# Patient Record
Sex: Male | Born: 1950 | Race: White | Hispanic: No | Marital: Married | Smoking: Former smoker
Health system: Southern US, Community
[De-identification: ages and names within clinical notes are randomized; demographics above are authoritative.]

## PROBLEM LIST (undated history)

## (undated) DIAGNOSIS — R200 Anesthesia of skin: Secondary | ICD-10-CM

## (undated) DIAGNOSIS — E039 Hypothyroidism, unspecified: Secondary | ICD-10-CM

## (undated) DIAGNOSIS — I10 Essential (primary) hypertension: Secondary | ICD-10-CM

## (undated) DIAGNOSIS — K5790 Diverticulosis of intestine, part unspecified, without perforation or abscess without bleeding: Secondary | ICD-10-CM

## (undated) DIAGNOSIS — Q639 Congenital malformation of kidney, unspecified: Secondary | ICD-10-CM

## (undated) DIAGNOSIS — C801 Malignant (primary) neoplasm, unspecified: Secondary | ICD-10-CM

## (undated) DIAGNOSIS — M1990 Unspecified osteoarthritis, unspecified site: Secondary | ICD-10-CM

## (undated) DIAGNOSIS — E7800 Pure hypercholesterolemia, unspecified: Secondary | ICD-10-CM

## (undated) DIAGNOSIS — G4730 Sleep apnea, unspecified: Secondary | ICD-10-CM

## (undated) DIAGNOSIS — F419 Anxiety disorder, unspecified: Secondary | ICD-10-CM

## (undated) HISTORY — PX: APPENDECTOMY: SHX54

## (undated) HISTORY — PX: CARDIAC CATHETERIZATION: SHX172

## (undated) HISTORY — PX: CATARACT EXTRACTION W/ INTRAOCULAR LENS IMPLANT: SHX1309

---

## 1990-01-07 HISTORY — PX: COLON SURGERY: SHX602

## 1999-12-07 ENCOUNTER — Encounter: Admission: RE | Admit: 1999-12-07 | Discharge: 1999-12-07 | Payer: Self-pay | Admitting: Internal Medicine

## 1999-12-07 ENCOUNTER — Inpatient Hospital Stay (HOSPITAL_COMMUNITY): Admission: EM | Admit: 1999-12-07 | Discharge: 1999-12-09 | Payer: Self-pay | Admitting: Emergency Medicine

## 1999-12-07 ENCOUNTER — Encounter: Payer: Self-pay | Admitting: Internal Medicine

## 1999-12-07 ENCOUNTER — Encounter: Payer: Self-pay | Admitting: Neurology

## 2002-12-03 ENCOUNTER — Other Ambulatory Visit: Payer: Self-pay

## 2005-01-09 ENCOUNTER — Ambulatory Visit: Payer: Self-pay | Admitting: Cardiology

## 2005-06-05 ENCOUNTER — Ambulatory Visit: Admission: RE | Admit: 2005-06-05 | Discharge: 2005-06-05 | Payer: Self-pay | Admitting: Internal Medicine

## 2009-01-07 HISTORY — PX: HERNIA REPAIR: SHX51

## 2011-01-08 HISTORY — PX: NASAL SINUS SURGERY: SHX719

## 2011-04-18 ENCOUNTER — Ambulatory Visit: Payer: Self-pay | Admitting: Otolaryngology

## 2011-06-10 ENCOUNTER — Other Ambulatory Visit (HOSPITAL_COMMUNITY): Payer: Self-pay | Admitting: Neurosurgery

## 2011-06-10 DIAGNOSIS — M4712 Other spondylosis with myelopathy, cervical region: Secondary | ICD-10-CM

## 2011-06-10 DIAGNOSIS — M5000 Cervical disc disorder with myelopathy, unspecified cervical region: Secondary | ICD-10-CM

## 2011-06-10 DIAGNOSIS — M67919 Unspecified disorder of synovium and tendon, unspecified shoulder: Secondary | ICD-10-CM

## 2011-06-13 ENCOUNTER — Other Ambulatory Visit (HOSPITAL_COMMUNITY): Payer: Self-pay

## 2011-06-17 ENCOUNTER — Other Ambulatory Visit (HOSPITAL_COMMUNITY): Payer: Self-pay | Admitting: Neurosurgery

## 2011-06-17 ENCOUNTER — Ambulatory Visit (HOSPITAL_COMMUNITY)
Admission: RE | Admit: 2011-06-17 | Discharge: 2011-06-17 | Disposition: A | Discharge status: Home / Self Care | Payer: BC Managed Care – PPO | Source: Ambulatory Visit | Attending: Neurosurgery | Admitting: Neurosurgery

## 2011-06-17 DIAGNOSIS — M47812 Spondylosis without myelopathy or radiculopathy, cervical region: Secondary | ICD-10-CM

## 2011-06-17 DIAGNOSIS — M25519 Pain in unspecified shoulder: Secondary | ICD-10-CM | POA: Insufficient documentation

## 2011-06-17 DIAGNOSIS — M719 Bursopathy, unspecified: Secondary | ICD-10-CM | POA: Insufficient documentation

## 2011-06-17 DIAGNOSIS — M67919 Unspecified disorder of synovium and tendon, unspecified shoulder: Secondary | ICD-10-CM | POA: Insufficient documentation

## 2011-06-17 DIAGNOSIS — M4712 Other spondylosis with myelopathy, cervical region: Secondary | ICD-10-CM | POA: Insufficient documentation

## 2011-06-17 DIAGNOSIS — M5000 Cervical disc disorder with myelopathy, unspecified cervical region: Secondary | ICD-10-CM | POA: Insufficient documentation

## 2011-06-24 ENCOUNTER — Other Ambulatory Visit: Payer: Self-pay | Admitting: Neurosurgery

## 2011-08-19 ENCOUNTER — Encounter (HOSPITAL_COMMUNITY)
Admission: RE | Admit: 2011-08-19 | Discharge: 2011-08-19 | Disposition: A | Discharge status: Home / Self Care | Payer: BC Managed Care – PPO | Source: Ambulatory Visit | Attending: Neurosurgery | Admitting: Neurosurgery

## 2011-08-19 ENCOUNTER — Encounter (HOSPITAL_COMMUNITY): Payer: Self-pay

## 2011-08-19 HISTORY — DX: Congenital malformation of kidney, unspecified: Q63.9

## 2011-08-19 HISTORY — DX: Essential (primary) hypertension: I10

## 2011-08-19 HISTORY — DX: Unspecified osteoarthritis, unspecified site: M19.90

## 2011-08-19 HISTORY — DX: Hypothyroidism, unspecified: E03.9

## 2011-08-19 HISTORY — DX: Sleep apnea, unspecified: G47.30

## 2011-08-19 HISTORY — DX: Malignant (primary) neoplasm, unspecified: C80.1

## 2011-08-19 LAB — BASIC METABOLIC PANEL
BUN: 15 mg/dL (ref 6–23)
CO2: 28 mEq/L (ref 19–32)
Calcium: 9.4 mg/dL (ref 8.4–10.5)
Chloride: 105 mEq/L (ref 96–112)
Creatinine, Ser: 1.08 mg/dL (ref 0.50–1.35)
GFR calc Af Amer: 84 mL/min — ABNORMAL LOW (ref >90)
GFR calc non Af Amer: 72 mL/min — ABNORMAL LOW (ref >90)
Glucose, Bld: 94 mg/dL (ref 70–99)
Potassium: 4.1 mEq/L (ref 3.5–5.1)
Sodium: 142 mEq/L (ref 135–145)

## 2011-08-19 LAB — CBC
HCT: 36.3 % — ABNORMAL LOW (ref 39.0–52.0)
Hemoglobin: 11.9 g/dL — ABNORMAL LOW (ref 13.0–17.0)
MCH: 31 pg (ref 26.0–34.0)
MCHC: 32.8 g/dL (ref 30.0–36.0)
MCV: 94.5 fL (ref 78.0–100.0)
Platelets: 235 10*3/uL (ref 150–400)
RBC: 3.84 MIL/uL — ABNORMAL LOW (ref 4.22–5.81)
RDW: 14.8 % (ref 11.5–15.5)
WBC: 6.7 10*3/uL (ref 4.0–10.5)

## 2011-08-19 LAB — SURGICAL PCR SCREEN
MRSA, PCR: NEGATIVE
Staphylococcus aureus: POSITIVE — AB

## 2011-08-19 NOTE — Progress Notes (Signed)
req'd sleep study from [facility] done 2 yrs ago, and ekg and cxr done jan 13 at [facility] dr Jeanmarie Plant [place] [phone]

## 2011-08-19 NOTE — Pre-Procedure Instructions (Addendum)
20 NEFTALY SWISS  08/19/2011   Your procedure is scheduled on:  08/28/11  Report to [facility] at 630 AM.  Call this number if you have problems the morning of surgery: [phone]   Remember:   Do not eat food: or drink After Midnight.    Take these medicines the morning of surgery with A SIP OF WATER: exforge, singulair, synthroid   Do not wear jewelry, make-up or nail polish.  Do not wear lotions, powders, or perfumes. You may wear deodorant.  Do not shave 48 hours prior to surgery. Men may shave face and neck.  Do not bring valuables to the hospital.  Contacts, dentures or bridgework may not be worn into surgery.  Leave suitcase in the car. After surgery it may be brought to your room.  For patients admitted to the hospital, checkout time is 11:00 AM the day of discharge.   Patients discharged the day of surgery will not be allowed to drive home.  Name and phone number of your driver: Liborio Nixon wife [phone]  Special Instructions: CHG Shower Use Special Wash: 1/2 bottle night before surgery and 1/2 bottle morning of surgery.   Please read over the following fact sheets that you were given: Pain Booklet, Coughing and Deep Breathing, MRSA Information and Surgical Site Infection Prevention   Exforge, synthroid, singulair

## 2011-08-27 MED ORDER — CEFAZOLIN SODIUM 1-5 GM-% IV SOLN: 1.0000 g | INTRAVENOUS | Status: DC

## 2011-08-27 MED ORDER — DEXTROSE 5 % IV SOLN
3.0000 g | INTRAVENOUS | Status: AC
  Administered 2011-08-28: 3 g via INTRAVENOUS
  Administered 2011-08-28: 1 g via INTRAVENOUS
  Filled 2011-08-27: qty 3000

## 2011-08-28 ENCOUNTER — Encounter (HOSPITAL_COMMUNITY): Payer: Self-pay | Admitting: *Deleted

## 2011-08-28 ENCOUNTER — Ambulatory Visit (HOSPITAL_COMMUNITY)
Admission: RE | Admit: 2011-08-28 | Discharge: 2011-08-29 | Disposition: A | Discharge status: Home / Self Care | Payer: BC Managed Care – PPO | Source: Ambulatory Visit | Attending: Neurosurgery | Admitting: Neurosurgery

## 2011-08-28 ENCOUNTER — Encounter (HOSPITAL_COMMUNITY)
Admission: RE | Disposition: A | Discharge status: Home / Self Care | Payer: Self-pay | Source: Ambulatory Visit | Attending: Neurosurgery

## 2011-08-28 ENCOUNTER — Encounter (HOSPITAL_COMMUNITY): Payer: Self-pay | Admitting: Anesthesiology

## 2011-08-28 ENCOUNTER — Ambulatory Visit (HOSPITAL_COMMUNITY): Payer: BC Managed Care – PPO

## 2011-08-28 ENCOUNTER — Ambulatory Visit (HOSPITAL_COMMUNITY): Payer: BC Managed Care – PPO | Admitting: Anesthesiology

## 2011-08-28 DIAGNOSIS — G4730 Sleep apnea, unspecified: Secondary | ICD-10-CM | POA: Insufficient documentation

## 2011-08-28 DIAGNOSIS — M4712 Other spondylosis with myelopathy, cervical region: Secondary | ICD-10-CM | POA: Insufficient documentation

## 2011-08-28 DIAGNOSIS — Z85038 Personal history of other malignant neoplasm of large intestine: Secondary | ICD-10-CM | POA: Insufficient documentation

## 2011-08-28 DIAGNOSIS — M5000 Cervical disc disorder with myelopathy, unspecified cervical region: Secondary | ICD-10-CM | POA: Insufficient documentation

## 2011-08-28 DIAGNOSIS — Z01812 Encounter for preprocedural laboratory examination: Secondary | ICD-10-CM | POA: Insufficient documentation

## 2011-08-28 DIAGNOSIS — E039 Hypothyroidism, unspecified: Secondary | ICD-10-CM | POA: Insufficient documentation

## 2011-08-28 DIAGNOSIS — I10 Essential (primary) hypertension: Secondary | ICD-10-CM | POA: Insufficient documentation

## 2011-08-28 HISTORY — PX: ANTERIOR CERVICAL DECOMP/DISCECTOMY FUSION: SHX1161

## 2011-08-28 SURGERY — ANTERIOR CERVICAL DECOMPRESSION/DISCECTOMY FUSION 3 LEVELS
Anesthesia: General | Site: Neck | Wound class: Clean

## 2011-08-28 MED ORDER — THROMBIN 5000 UNITS EX SOLR
OROMUCOSAL | Status: DC | PRN
  Administered 2011-08-28 (×2): via TOPICAL

## 2011-08-28 MED ORDER — IRBESARTAN 150 MG PO TABS
150.0000 mg | ORAL_TABLET | Freq: Every day | ORAL | Status: DC
  Administered 2011-08-28: 150 mg via ORAL
  Filled 2011-08-28 (×2): qty 1

## 2011-08-28 MED ORDER — KETOROLAC TROMETHAMINE 30 MG/ML IJ SOLN
30.0000 mg | Freq: Once | INTRAMUSCULAR | Status: AC
  Administered 2011-08-28: 30 mg via INTRAVENOUS

## 2011-08-28 MED ORDER — HYDROXYZINE HCL 50 MG/ML IM SOLN
50.0000 mg | INTRAMUSCULAR | Status: DC | PRN
  Administered 2011-08-28: 50 mg via INTRAMUSCULAR
  Filled 2011-08-28: qty 1

## 2011-08-28 MED ORDER — HYDROXYZINE HCL 25 MG PO TABS: 50.0000 mg | ORAL_TABLET | ORAL | Status: DC | PRN

## 2011-08-28 MED ORDER — CEFAZOLIN SODIUM 1-5 GM-% IV SOLN
INTRAVENOUS | Status: AC
  Filled 2011-08-28: qty 50

## 2011-08-28 MED ORDER — HYDROMORPHONE HCL PF 1 MG/ML IJ SOLN
INTRAMUSCULAR | Status: AC
  Filled 2011-08-28: qty 1

## 2011-08-28 MED ORDER — THROMBIN 20000 UNITS EX SOLR
CUTANEOUS | Status: DC | PRN
  Administered 2011-08-28: 09:00:00 via TOPICAL

## 2011-08-28 MED ORDER — SODIUM CHLORIDE 0.9 % IR SOLN
Status: DC | PRN
  Administered 2011-08-28: 09:00:00

## 2011-08-28 MED ORDER — SODIUM CHLORIDE 0.9 % IV SOLN
INTRAVENOUS | Status: AC
  Filled 2011-08-28: qty 500

## 2011-08-28 MED ORDER — ALUM & MAG HYDROXIDE-SIMETH 200-200-20 MG/5ML PO SUSP: 30.0000 mL | Freq: Four times a day (QID) | ORAL | Status: DC | PRN

## 2011-08-28 MED ORDER — LIDOCAINE-EPINEPHRINE 1 %-1:100000 IJ SOLN
INTRAMUSCULAR | Status: DC | PRN
  Administered 2011-08-28: 14 mL

## 2011-08-28 MED ORDER — PHENOL 1.4 % MT LIQD: 1.0000 | OROMUCOSAL | Status: DC | PRN

## 2011-08-28 MED ORDER — ACETAMINOPHEN 10 MG/ML IV SOLN
INTRAVENOUS | Status: AC
  Filled 2011-08-28: qty 100

## 2011-08-28 MED ORDER — KETOROLAC TROMETHAMINE 30 MG/ML IJ SOLN
30.0000 mg | Freq: Four times a day (QID) | INTRAMUSCULAR | Status: DC
  Administered 2011-08-28 – 2011-08-29 (×3): 30 mg via INTRAVENOUS
  Filled 2011-08-28 (×7): qty 1

## 2011-08-28 MED ORDER — ROCURONIUM BROMIDE 100 MG/10ML IV SOLN
INTRAVENOUS | Status: DC | PRN
  Administered 2011-08-28: 50 mg via INTRAVENOUS

## 2011-08-28 MED ORDER — BACITRACIN 50000 UNITS IM SOLR
INTRAMUSCULAR | Status: AC
  Filled 2011-08-28: qty 1

## 2011-08-28 MED ORDER — MIDAZOLAM HCL 5 MG/5ML IJ SOLN
INTRAMUSCULAR | Status: DC | PRN
  Administered 2011-08-28: 2 mg via INTRAVENOUS

## 2011-08-28 MED ORDER — BUPIVACAINE HCL (PF) 0.5 % IJ SOLN
INTRAMUSCULAR | Status: DC | PRN
  Administered 2011-08-28: 14 mL

## 2011-08-28 MED ORDER — PHENYLEPHRINE HCL 10 MG/ML IJ SOLN
10.0000 mg | INTRAVENOUS | Status: DC | PRN
  Administered 2011-08-28: 10 ug/min via INTRAVENOUS

## 2011-08-28 MED ORDER — ATORVASTATIN CALCIUM 20 MG PO TABS
20.0000 mg | ORAL_TABLET | Freq: Every day | ORAL | Status: DC
  Administered 2011-08-28: 20 mg via ORAL
  Filled 2011-08-28 (×2): qty 1

## 2011-08-28 MED ORDER — PHENYLEPHRINE HCL 10 MG/ML IJ SOLN
INTRAMUSCULAR | Status: DC | PRN
  Administered 2011-08-28 (×2): 40 ug via INTRAVENOUS

## 2011-08-28 MED ORDER — ACETAMINOPHEN 325 MG PO TABS: 650.0000 mg | ORAL_TABLET | ORAL | Status: DC | PRN

## 2011-08-28 MED ORDER — AMLODIPINE BESYLATE 10 MG PO TABS
10.0000 mg | ORAL_TABLET | Freq: Every day | ORAL | Status: DC
  Filled 2011-08-28: qty 1

## 2011-08-28 MED ORDER — PROPOFOL 10 MG/ML IV EMUL: INTRAVENOUS | Status: DC | PRN

## 2011-08-28 MED ORDER — THROMBIN 20000 UNITS EX KIT
PACK | CUTANEOUS | Status: DC | PRN
  Administered 2011-08-28: 20000 [IU] via TOPICAL

## 2011-08-28 MED ORDER — HYDROCODONE-ACETAMINOPHEN 5-325 MG PO TABS: 1.0000 | ORAL_TABLET | ORAL | Status: DC | PRN

## 2011-08-28 MED ORDER — MENTHOL 3 MG MT LOZG
1.0000 | LOZENGE | OROMUCOSAL | Status: DC | PRN
  Filled 2011-08-28: qty 9

## 2011-08-28 MED ORDER — ONDANSETRON HCL 4 MG/2ML IJ SOLN
INTRAMUSCULAR | Status: DC | PRN
  Administered 2011-08-28: 4 mg via INTRAVENOUS

## 2011-08-28 MED ORDER — GLYCOPYRROLATE 0.2 MG/ML IJ SOLN
INTRAMUSCULAR | Status: DC | PRN
  Administered 2011-08-28: .6 mg via INTRAVENOUS

## 2011-08-28 MED ORDER — ZOLPIDEM TARTRATE 5 MG PO TABS: 10.0000 mg | ORAL_TABLET | Freq: Every evening | ORAL | Status: DC | PRN

## 2011-08-28 MED ORDER — BISACODYL 10 MG RE SUPP: 10.0000 mg | Freq: Every day | RECTAL | Status: DC | PRN

## 2011-08-28 MED ORDER — MORPHINE SULFATE 4 MG/ML IJ SOLN: 4.0000 mg | INTRAMUSCULAR | Status: DC | PRN

## 2011-08-28 MED ORDER — AMLODIPINE BESYLATE-VALSARTAN 10-160 MG PO TABS: 1.0000 | ORAL_TABLET | Freq: Every day | ORAL | Status: DC

## 2011-08-28 MED ORDER — LACTATED RINGERS IV SOLN
INTRAVENOUS | Status: DC | PRN
  Administered 2011-08-28 (×3): via INTRAVENOUS

## 2011-08-28 MED ORDER — THROMBIN 5000 UNITS EX SOLR
CUTANEOUS | Status: DC | PRN
  Administered 2011-08-28 (×2): 5000 [IU] via TOPICAL

## 2011-08-28 MED ORDER — OXYCODONE-ACETAMINOPHEN 5-325 MG PO TABS
1.0000 | ORAL_TABLET | ORAL | Status: DC | PRN
Start: 2011-08-28 — End: 2011-08-29
  Administered 2011-08-29: 1 via ORAL
  Filled 2011-08-28: qty 1

## 2011-08-28 MED ORDER — OXYCODONE HCL 5 MG PO TABS
5.0000 mg | ORAL_TABLET | ORAL | Status: DC | PRN
  Administered 2011-08-28: 5 mg via ORAL
  Filled 2011-08-28: qty 1

## 2011-08-28 MED ORDER — SODIUM CHLORIDE 0.9 % IV SOLN: 250.0000 mL | INTRAVENOUS | Status: DC

## 2011-08-28 MED ORDER — DEXAMETHASONE SODIUM PHOSPHATE 4 MG/ML IJ SOLN
INTRAMUSCULAR | Status: DC | PRN
  Administered 2011-08-28: 8 mg via INTRAVENOUS

## 2011-08-28 MED ORDER — ACETAMINOPHEN 10 MG/ML IV SOLN
INTRAVENOUS | Status: DC | PRN
  Administered 2011-08-28: 1000 mg via INTRAVENOUS

## 2011-08-28 MED ORDER — LIDOCAINE HCL (CARDIAC) 20 MG/ML IV SOLN
INTRAVENOUS | Status: DC | PRN
  Administered 2011-08-28: 50 mg via INTRAVENOUS

## 2011-08-28 MED ORDER — AMLODIPINE BESYLATE 10 MG PO TABS
10.0000 mg | ORAL_TABLET | Freq: Every day | ORAL | Status: DC
  Administered 2011-08-28: 10 mg via ORAL
  Filled 2011-08-28 (×2): qty 1

## 2011-08-28 MED ORDER — HYDROMORPHONE HCL PF 1 MG/ML IJ SOLN
0.2500 mg | INTRAMUSCULAR | Status: DC | PRN
  Administered 2011-08-28 (×2): 0.5 mg via INTRAVENOUS

## 2011-08-28 MED ORDER — CYCLOBENZAPRINE HCL 10 MG PO TABS
10.0000 mg | ORAL_TABLET | Freq: Three times a day (TID) | ORAL | Status: DC | PRN
  Administered 2011-08-28: 10 mg via ORAL
  Filled 2011-08-28: qty 1

## 2011-08-28 MED ORDER — 0.9 % SODIUM CHLORIDE (POUR BTL) OPTIME
TOPICAL | Status: DC | PRN
  Administered 2011-08-28: 1000 mL

## 2011-08-28 MED ORDER — KCL IN DEXTROSE-NACL 20-5-0.45 MEQ/L-%-% IV SOLN
INTRAVENOUS | Status: DC
  Administered 2011-08-28: 17:00:00 via INTRAVENOUS
  Filled 2011-08-28 (×7): qty 1000

## 2011-08-28 MED ORDER — SODIUM CHLORIDE 0.9 % IJ SOLN: 3.0000 mL | INTRAMUSCULAR | Status: DC | PRN

## 2011-08-28 MED ORDER — MAGNESIUM HYDROXIDE 400 MG/5ML PO SUSP: 30.0000 mL | Freq: Every day | ORAL | Status: DC | PRN

## 2011-08-28 MED ORDER — ACETAMINOPHEN 10 MG/ML IV SOLN
1000.0000 mg | Freq: Four times a day (QID) | INTRAVENOUS | Status: DC
  Administered 2011-08-28 – 2011-08-29 (×3): 1000 mg via INTRAVENOUS
  Filled 2011-08-28 (×4): qty 100

## 2011-08-28 MED ORDER — KETOROLAC TROMETHAMINE 30 MG/ML IJ SOLN
INTRAMUSCULAR | Status: AC
  Filled 2011-08-28: qty 1

## 2011-08-28 MED ORDER — MONTELUKAST SODIUM 10 MG PO TABS
10.0000 mg | ORAL_TABLET | Freq: Every day | ORAL | Status: DC
  Filled 2011-08-28: qty 1

## 2011-08-28 MED ORDER — LEVOTHYROXINE SODIUM 200 MCG PO TABS
200.0000 ug | ORAL_TABLET | Freq: Every day | ORAL | Status: DC
  Filled 2011-08-28: qty 1

## 2011-08-28 MED ORDER — IRBESARTAN 150 MG PO TABS
150.0000 mg | ORAL_TABLET | Freq: Every day | ORAL | Status: DC
  Filled 2011-08-28: qty 1

## 2011-08-28 MED ORDER — LIDOCAINE HCL (CARDIAC) 20 MG/ML IV SOLN: INTRAVENOUS | Status: DC | PRN

## 2011-08-28 MED ORDER — ARTIFICIAL TEARS OP OINT
TOPICAL_OINTMENT | OPHTHALMIC | Status: DC | PRN
  Administered 2011-08-28: 1 via OPHTHALMIC

## 2011-08-28 MED ORDER — VECURONIUM BROMIDE 10 MG IV SOLR
INTRAVENOUS | Status: DC | PRN
  Administered 2011-08-28: 3 mg via INTRAVENOUS
  Administered 2011-08-28: 4 mg via INTRAVENOUS
  Administered 2011-08-28: 3 mg via INTRAVENOUS
  Administered 2011-08-28 (×2): 2 mg via INTRAVENOUS

## 2011-08-28 MED ORDER — ACETAMINOPHEN 650 MG RE SUPP: 650.0000 mg | RECTAL | Status: DC | PRN

## 2011-08-28 MED ORDER — FENTANYL CITRATE 0.05 MG/ML IJ SOLN
INTRAMUSCULAR | Status: DC | PRN
  Administered 2011-08-28 (×2): 100 ug via INTRAVENOUS
  Administered 2011-08-28 (×5): 50 ug via INTRAVENOUS

## 2011-08-28 MED ORDER — SODIUM CHLORIDE 0.9 % IJ SOLN: 3.0000 mL | Freq: Two times a day (BID) | INTRAMUSCULAR | Status: DC

## 2011-08-28 MED ORDER — PROPOFOL 10 MG/ML IV EMUL
INTRAVENOUS | Status: DC | PRN
  Administered 2011-08-28: 300 mg via INTRAVENOUS

## 2011-08-28 MED ORDER — NEOSTIGMINE METHYLSULFATE 1 MG/ML IJ SOLN
INTRAMUSCULAR | Status: DC | PRN
  Administered 2011-08-28: 4 mg via INTRAVENOUS

## 2011-08-28 MED ORDER — ONDANSETRON HCL 4 MG/2ML IJ SOLN: 4.0000 mg | Freq: Once | INTRAMUSCULAR | Status: DC | PRN

## 2011-08-28 SURGICAL SUPPLY — 58 items
ALLOGRAFT 7X14X11 (Bone Implant) ×6 IMPLANT
BAG DECANTER FOR FLEXI CONT (MISCELLANEOUS) ×2 IMPLANT
BIT DRILL NEURO 2X3.1 SFT TUCH (MISCELLANEOUS) ×1 IMPLANT
BLADE ULTRA TIP 2M (BLADE) ×2 IMPLANT
BRUSH SCRUB EZ PLAIN DRY (MISCELLANEOUS) ×2 IMPLANT
CANISTER SUCTION 2500CC (MISCELLANEOUS) ×2 IMPLANT
CLIP TI MEDIUM 6 (CLIP) ×2 IMPLANT
CLOTH BEACON ORANGE TIMEOUT ST (SAFETY) ×2 IMPLANT
CONT SPEC 4OZ CLIKSEAL STRL BL (MISCELLANEOUS) ×2 IMPLANT
COVER MAYO STAND STRL (DRAPES) ×2 IMPLANT
DECANTER SPIKE VIAL GLASS SM (MISCELLANEOUS) ×2 IMPLANT
DERMABOND ADVANCED (GAUZE/BANDAGES/DRESSINGS) ×2
DERMABOND ADVANCED .7 DNX12 (GAUZE/BANDAGES/DRESSINGS) ×2 IMPLANT
DRAPE LAPAROTOMY 100X72 PEDS (DRAPES) ×2 IMPLANT
DRAPE MICROSCOPE LEICA (MISCELLANEOUS) ×2 IMPLANT
DRAPE POUCH INSTRU U-SHP 10X18 (DRAPES) ×2 IMPLANT
DRAPE PROXIMA HALF (DRAPES) IMPLANT
DRILL NEURO 2X3.1 SOFT TOUCH (MISCELLANEOUS) ×2
ELECT COATED BLADE 2.86 ST (ELECTRODE) ×2 IMPLANT
ELECT REM PT RETURN 9FT ADLT (ELECTROSURGICAL) ×2
ELECTRODE REM PT RTRN 9FT ADLT (ELECTROSURGICAL) ×1 IMPLANT
GAUZE SPONGE 4X4 16PLY XRAY LF (GAUZE/BANDAGES/DRESSINGS) ×2 IMPLANT
GLOVE BIO SURGEON STRL SZ8 (GLOVE) ×2 IMPLANT
GLOVE BIOGEL PI IND STRL 7.0 (GLOVE) ×1 IMPLANT
GLOVE BIOGEL PI IND STRL 8 (GLOVE) ×1 IMPLANT
GLOVE BIOGEL PI INDICATOR 7.0 (GLOVE) ×1
GLOVE BIOGEL PI INDICATOR 8 (GLOVE) ×1
GLOVE ECLIPSE 7.5 STRL STRAW (GLOVE) ×2 IMPLANT
GLOVE EXAM NITRILE LRG STRL (GLOVE) ×4 IMPLANT
GLOVE EXAM NITRILE MD LF STRL (GLOVE) IMPLANT
GLOVE EXAM NITRILE XL STR (GLOVE) IMPLANT
GLOVE EXAM NITRILE XS STR PU (GLOVE) IMPLANT
GLOVE SURG SS PI 6.5 STRL IVOR (GLOVE) ×4 IMPLANT
GOWN BRE IMP SLV AUR LG STRL (GOWN DISPOSABLE) ×2 IMPLANT
GOWN BRE IMP SLV AUR XL STRL (GOWN DISPOSABLE) ×2 IMPLANT
GOWN STRL REIN 2XL LVL4 (GOWN DISPOSABLE) IMPLANT
GRAFT CORT CANC 9X14X11MM (Bone Implant) ×2 IMPLANT
HEAD HALTER (SOFTGOODS) ×2 IMPLANT
HEMOSTAT POWDER KIT SURGIFOAM (HEMOSTASIS) ×4 IMPLANT
KIT BASIN OR (CUSTOM PROCEDURE TRAY) ×2 IMPLANT
KIT ROOM TURNOVER OR (KITS) ×2 IMPLANT
NEEDLE HYPO 25X1 1.5 SAFETY (NEEDLE) ×2 IMPLANT
NS IRRIG 1000ML POUR BTL (IV SOLUTION) ×2 IMPLANT
PACK LAMINECTOMY NEURO (CUSTOM PROCEDURE TRAY) ×2 IMPLANT
PAD ARMBOARD 7.5X6 YLW CONV (MISCELLANEOUS) ×6 IMPLANT
PATTIES SURGICAL 1X1 (DISPOSABLE) ×2 IMPLANT
RUBBERBAND STERILE (MISCELLANEOUS) ×4 IMPLANT
SPONGE INTESTINAL PEANUT (DISPOSABLE) ×4 IMPLANT
SPONGE SURGIFOAM ABS GEL 100 (HEMOSTASIS) ×2 IMPLANT
STAPLER SKIN PROX WIDE 3.9 (STAPLE) ×2 IMPLANT
SUT VIC AB 0 CT1 18XCR BRD8 (SUTURE) IMPLANT
SUT VIC AB 0 CT1 8-18 (SUTURE)
SUT VIC AB 2-0 CP2 18 (SUTURE) ×2 IMPLANT
SUT VIC AB 3-0 SH 8-18 (SUTURE) ×4 IMPLANT
SYR 20ML ECCENTRIC (SYRINGE) ×2 IMPLANT
TOWEL OR 17X24 6PK STRL BLUE (TOWEL DISPOSABLE) ×2 IMPLANT
TOWEL OR 17X26 10 PK STRL BLUE (TOWEL DISPOSABLE) ×2 IMPLANT
WATER STERILE IRR 1000ML POUR (IV SOLUTION) ×2 IMPLANT

## 2011-08-28 NOTE — Op Note (Signed)
08/28/2011  12:56 PM  PATIENT:  Andrew Lam  60 y.o. 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  ANESTHESIA:   general  EBL:  Total I/O In: 2000 [I.V.:2000] Out: 375 [Urine:275; Blood:100]  COUNT: Correct per nursing staff  DICTATION: Patient was brought to the operating room placed under general endotracheal anesthesia. Patient was placed in 10 pounds of halter traction. The neck was prepped with Betadine soap and solution and draped in a sterile fashion. A obliquel incision was made on the left side of the neck paralleling the anterior border of the sternocleidomastoid.. The line of the incision was infiltrated with local anesthetic with epinephrine. Dissection was carried down thru the subcutaneous tissue and platysma, bipolar cautery was used to maintain hemostasis. Hemoclips were used to take several superficial veins. Dissection was then carried out thru an avascular plane leaving the sternocleidomastoid carotid artery and jugular vein laterally and the trachea and esophagus medially. The ventral aspect of the vertebral column was identified and a localizing x-ray was taken. The C4-5, C5-6, and C6-7 levels were identified. The annulus at each level was incised and the disc space entered. Discectomy was performed with micro-curettes and pituitary rongeurs. The operating microscope was draped and brought into the field provided additional magnification illumination and visualization. Discectomy was continued posteriorly  thru the disc space and then the cartilaginous endplate was removed using micro-curettes along with the high-speed drill. Posterior osteophytic overgrowth was removed at each level using the high-speed drill along with a 2 mm thin footplated Kerrison punch. Posterior longitudinal ligament along with disc herniation was carefully removed, decompressing the spinal canal and thecal sac. We then continued to remove osteophytic overgrowth and disc material decompressing the neural foramina and exiting nerve roots bilaterally. Once the decompression was completed hemostasis was established at each level with the use of Gelfoam with thrombin and bipolar cautery. The Gelfoam was removed the wound irrigated and hemostasis confirmed. We then measured the height of each intravertebral disc space level and selected a 7 millimeter in height structural allograft for the C4-5 level, a 7 millimeter in height structural allograft for the C5-6 level, and a 9 millimeter in height structural allograft for the C6-7 level . Each was hydrated in saline solution and then gently positioned in the intravertebral disc space and countersunk. We then selected a 55 millimeter in height Tether cervical plate. It was positioned over the fusion construct and secured to the vertebra with a pair of 4 x 14 mm fixed screws at C4, a single 4 x 15 mm variable screw at C5, a single 4 x 15 mm fixed screw at C6 and a pair of 4 x 15 mm variable screws at C7. Each screw hole was started with the high-speed drill and then the screws placed, once all the screws were placed final tightening was performed. The wound was irrigated with bacitracin solution checked for hemostasis which was established and confirmed. An x-ray was taken which showed the upper portion of the fusion construct, grasper in good position at C4-5 and C5-6, screws and plate were in good position from C4-C6, the C6-7 level could not  be well visualized. We then proceeded with closure. The  platysma was closed with interrupted inverted 2-0 undyed Vicryl suture, the subcutaneous and subcuticular closed with interrupted inverted 3-0 undyed Vicryl suture. The skin edges were approximated with Dermabond. Following surgery the patient was taken out of cervical traction. To be reversed and the anesthetic and taken to the recovery room for further care.   PLAN OF CARE: Admit for overnight observation  PATIEN and a T DISPOSITION:  PACU - hemodynamically stable.   Delay start of Pharmacological VTE agent (>24hrs) due to surgical blood loss or risk of bleeding:  yes

## 2011-08-28 NOTE — Plan of Care (Signed)
Problem: Consults Goal: Diagnosis - Spinal Surgery Outcome: Completed/Met Date Met:  08/28/11 Cervical Spine Fusion     

## 2011-08-28 NOTE — H&P (Signed)
Subjective: Patient is a 61 y.o. male who is admitted for treatment of cervical myelopathy secondary to multilevel spondylytic disc herniations, spondylosis, and DDD.  These degenerative changes resulted in multilevel cervical stenosis. Patient presented with neck and right shoulder pain, but was found on examination to have notable hyperreflexia in the distal lower extremities, consistent with myelopathy. Patient was also found to have right rotator cuff degeneration. Patient admitted now for a 3 level, C4-5, C5-6, and C6-7 ACDF.   Past Medical History  Diagnosis Date  . Hypertension   . Hypothyroidism   . Sleep apnea     cpap occ use  sleep study  8 months ago  . 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 Cancer     colon 21 yrs ago  . Arthritis     Past Surgical History  Procedure Date  . Colon surgery 89    ca  . Nasal sinus surgery 13  . Hernia repair 11    lft  . Appendectomy     Prescriptions prior to admission  Medication Sig Dispense Refill  . amLODipine-valsartan (EXFORGE) 10-160 MG per tablet Take 1 tablet by mouth daily.      Marland Kitchen levothyroxine (SYNTHROID, LEVOTHROID) 200 MCG tablet Take 200 mcg by mouth daily.      . montelukast (SINGULAIR) 10 MG tablet Take 10 mg by mouth at bedtime.      . rosuvastatin (CRESTOR) 10 MG tablet Take 10 mg by mouth daily.      . CYANOCOBALAMIN PO Take 1 tablet by mouth daily.       Allergies  Allergen Reactions  . Codeine Nausea Only    History  Substance Use Topics  . Smoking status: Former Smoker -- 1.5 packs/day for 20 years    Types: Cigarettes    Quit date: 08/19/1990  . Smokeless tobacco: Not on file  . Alcohol Use: No    History reviewed. No pertinent family history.   Review of Systems A comprehensive review of systems was negative.  Objective: Vital signs in last 24 hours: Temp:  [97.5 F (36.4 C)] 97.5 F (36.4 C) (08/21 1610) Pulse Rate:  [62] 62  (08/21 0637) Resp:  [18] 18  (08/21  0637) BP: (122)/(77) 122/77 mmHg (08/21 0637) SpO2:  [97 %] 97 % (08/21 0637)  EXAM: There is a well-developed well-nourished white male in no acute distress.  Lungs are clear to auscultation , the patient has symmetrical respiratory excursion. Heart has a regular rate and rhythm normal S1 and S2 no murmur.   Abdomen is soft nontender nondistended bowel sounds are present. Extremity examination shows no clubbing cyanosis or edema. Musculoskeletal examination shows noticed a patient of the cervical spinous processes or paracervical musculature. He has a good range of motion of the neck through flexion, extension, and lateral flexion to either side. He does have tenderness to palpation of the right a.c. joint, but no tenderness over the left a.c. joint. He has discomfort testing for impingement right shoulder. No discomfort tested for impingement left shoulder. Motor examination shows 5 over 5 strength in the upper extremities including the deltoid biceps triceps and intrinsics and grip. Sensation is intact to pinprick throughout the digits of the upper extremities. Reflexes are 1-2 and the biceps and brachialis bonny, 23 triceps probably, 3 in the quadriceps bilaterally, gastrocnemius are 4 bilaterally with sustained clonus on the left and 4-5 beats of clonus on the right. Toes are upgoing bilaterally. Patient  has a normal gait and stance.   Data Review:CBC    Component Value Date/Time   WBC 6.7 08/19/2011 1441   RBC 3.84* 08/19/2011 1441   HGB 11.9* 08/19/2011 1441   HCT 36.3* 08/19/2011 1441   PLT 235 08/19/2011 1441   MCV 94.5 08/19/2011 1441   MCH 31.0 08/19/2011 1441   MCHC 32.8 08/19/2011 1441   RDW 14.8 08/19/2011 1441                          BMET    Component Value Date/Time   NA 142 08/19/2011 1441   K 4.1 08/19/2011 1441   CL 105 08/19/2011 1441   CO2 28 08/19/2011 1441   GLUCOSE 94 08/19/2011 1441   BUN 15 08/19/2011 1441   CREATININE 1.08 08/19/2011 1441   CALCIUM 9.4 08/19/2011 1441    GFRNONAA 72* 08/19/2011 1441   GFRAA 84* 08/19/2011 1441     Assessment/Plan: Patient with cervical myelopathy secondary to multilevel cervical spondylitic disc herniations, cervical spondylosis, and cervical degenerative disc disease. These degenerative changes result in stenosis. Patient also has right rotator cuff degeneration, with documented tear by MRI. He is a bit now for a 3 level ACDF. I've discussed with the patient the nature of his condition, the nature the surgical procedure, the typical length of surgery, hospital stay, and overall recuperation. We discussed limitations postoperatively. I discussed risks of surgery including risks of infection, bleeding, possibly need for transfusion, the risk of nerve root dysfunction with pain, weakness, numbness, or paresthesias, the risk of spinal cord dysfunction with paralysis of all 4 limbs and quadriplegia, and the risk of dural tear and CSF leakage and possible need for further surgery, the risk of esophageal dysfunction causing dysphagia and the risk of laryngeal dysfunction causing hoarseness of the voice, the risk of failure of the arthrodesis and the possible need for further surgery, and the risk of anesthetic complications including myocardial infarction, stroke, pneumonia, and death. We also discussed the need for postoperative immobilization in a cervical collar. Understanding all this the patient does wish to proceed with surgery and is admitted for such.    Hewitt Shorts, MD 08/28/2011 8:29 AM

## 2011-08-28 NOTE — Anesthesia Preprocedure Evaluation (Addendum)
Anesthesia Evaluation  Patient identified by MRN, date of birth, ID band Patient awake    Reviewed: Allergy & Precautions, H&P , NPO status , Patient's Chart, lab work & pertinent test results  Airway Mallampati: I TM Distance: <3 FB Neck ROM: Limited    Dental  (+) Teeth Intact, Dental Advisory Given and Poor Dentition   Pulmonary sleep apnea ,          Cardiovascular hypertension, Rhythm:regular Rate:Normal     Neuro/Psych    GI/Hepatic   Endo/Other    Renal/GU      Musculoskeletal   Abdominal   Peds  Hematology   Anesthesia Other Findings   Reproductive/Obstetrics                          Anesthesia Physical Anesthesia Plan  ASA: II  Anesthesia Plan: General   Post-op Pain Management:    Induction: Intravenous  Airway Management Planned: Oral ETT  Additional Equipment:   Intra-op Plan:   Post-operative Plan: Extubation in OR  Informed Consent: I have reviewed the patients History and Physical, chart, labs and discussed the procedure including the risks, benefits and alternatives for the proposed anesthesia with the patient or authorized representative who has indicated his/her understanding and acceptance.     Plan Discussed with: CRNA, Anesthesiologist and Surgeon  Anesthesia Plan Comments:         Anesthesia Quick Evaluation

## 2011-08-28 NOTE — Anesthesia Procedure Notes (Signed)
Procedure Name: Intubation Date/Time: 08/28/2011 8:51 AM Performed by: Luster Landsberg Pre-anesthesia Checklist: Patient identified, Emergency Drugs available, Suction available and Patient being monitored Patient Re-evaluated:Patient Re-evaluated prior to inductionOxygen Delivery Method: Circle system utilized Preoxygenation: Pre-oxygenation with 100% oxygen Intubation Type: IV induction Ventilation: Oral airway inserted - appropriate to patient size and Mask ventilation without difficulty Grade View: Grade I Tube type: Oral Tube size: 8.0 mm Number of attempts: 1 Airway Equipment and Method: Video-laryngoscopy and Stylet Placement Confirmation: ETT inserted through vocal cords under direct vision,  positive ETCO2 and breath sounds checked- equal and bilateral Secured at: 24 cm Tube secured with: Tape Dental Injury: Teeth and Oropharynx as per pre-operative assessment  Difficulty Due To: Difficulty was anticipated, Difficult Airway- due to reduced neck mobility, Difficult Airway- due to limited oral opening and Difficult Airway- due to anterior larynx Comments: Head/neck maintained in neutral position during DVL

## 2011-08-28 NOTE — Transfer of Care (Signed)
Immediate Anesthesia Transfer of Care Note  Patient: Andrew Lam  Procedure(s) Performed: Procedure(s) (LRB): ANTERIOR CERVICAL DECOMPRESSION/DISCECTOMY FUSION 3 LEVELS (N/A)  Patient Location: PACU  Anesthesia Type: General  Level of Consciousness: awake  Airway & Oxygen Therapy: Patient Spontanous Breathing and Patient connected to nasal cannula oxygen  Post-op Assessment: Report given to PACU RN, Post -op Vital signs reviewed and stable and Patient moving all extremities  Post vital signs: Reviewed and stable  Complications: No apparent anesthesia complications

## 2011-08-28 NOTE — Anesthesia Postprocedure Evaluation (Signed)
  Anesthesia Post-op Note  Patient: Andrew Lam  Procedure(s) Performed: Procedure(s) (LRB): ANTERIOR CERVICAL DECOMPRESSION/DISCECTOMY FUSION 3 LEVELS (N/A)  Patient Location: PACU  Anesthesia Type: General  Level of Consciousness: awake, oriented, sedated and patient cooperative  Airway and Oxygen Therapy: Patient Spontanous Breathing and Patient connected to nasal cannula oxygen  Post-op Pain: mild  Post-op Assessment: Post-op Vital signs reviewed, Patient's Cardiovascular Status Stable, Respiratory Function Stable, Patent Airway, No signs of Nausea or vomiting and Pain level controlled  Post-op Vital Signs: stable  Complications: No apparent anesthesia complications

## 2011-08-28 NOTE — Progress Notes (Signed)
Filed Vitals:   08/28/11 1335 08/28/11 1405 08/28/11 1615 08/28/11 2000  BP: 102/81 137/87 145/91 128/72  Pulse:  74 71 86  Temp:  98 F (36.7 C) 97.6 F (36.4 C) 97.5 F (36.4 C)  TempSrc:  Oral Axillary   Resp:  18 18 18   SpO2:  96% 96% 91%    Patient resting in bed. Up and living in the halls with a staff. Had some nausea earlier, given Vistaril, but had soup and potatoes for dinner, and has held those down. Wound clean and dry. Has voided 175 cc, after ambulating; bladder scan revealed 180 cc. We'll continue to ambulate in halls. We'll change IV KVO, and saline lock after next IV meds.  Plan: Will continue to progress to postoperative recovery.  Hewitt Shorts, MD 08/28/2011, 10:16 PM   Think it is

## 2011-08-28 NOTE — Progress Notes (Signed)
Pt. Has foley catheter in place from OR. Catheter was D/cd at 1745 per order.Pt.tolerated procedure well with no complication..RN will monitor patient to void soon

## 2011-08-29 ENCOUNTER — Encounter (HOSPITAL_COMMUNITY): Payer: Self-pay | Admitting: Neurosurgery

## 2011-08-29 MED ORDER — HYDROCODONE-ACETAMINOPHEN 5-325 MG PO TABS: 1.0000 | ORAL_TABLET | ORAL | Status: AC | PRN

## 2011-08-29 NOTE — Progress Notes (Signed)
Pt doing very well. Pt is up ambulating and has minimal pain. Pt given D/C instructions with Rx, Pt verbalized understanding. Pt D/C'd home via wheelchair @ 1120 per MD order. Rema Fendt, RN

## 2011-08-29 NOTE — Discharge Summary (Signed)
Physician Discharge Summary  Patient ID: Andrew Lam MRN: [id number] DOB/AGE: 61-Oct-1952 61 y.o.  Admit date: 08/28/2011 Discharge date: 08/29/2011  Admission Diagnoses:  Cervical hnp with myelopathy, cervical spondylosis, cervical stenosis  Discharge Diagnoses:  Cervical hnp with myelopathy, cervical spondylosis, cervical stenosis  Discharged Condition: good  Hospital Course: Patient was admitted, underwent a 3 level C4-5, C5-6, and C6-7 ACDF. He has done well following surgery. He's been up and ambulate in the halls. He is voiding well. He is comfortable. His wound is healing nicely. He is being discharged home with instructions regarding wound care and activities. He is to return for followup with me in the office in 3 weeks.  Discharge Exam: Blood pressure 121/78, pulse 73, temperature 97.4 F (36.3 C), temperature source Axillary, resp. rate 16, SpO2 93.00%.  Disposition: Home   Medication List  As of 08/29/2011  9:32 AM   TAKE these medications         CYANOCOBALAMIN PO   Take 1 tablet by mouth daily.      EXFORGE 10-160 MG per tablet   Generic drug: amLODipine-valsartan   Take 1 tablet by mouth daily.      HYDROcodone-acetaminophen 5-325 MG per tablet   Commonly known as: NORCO/VICODIN   Take 1-2 tablets by mouth every 4 (four) hours as needed for pain.      levothyroxine 200 MCG tablet   Commonly known as: SYNTHROID, LEVOTHROID   Take 200 mcg by mouth daily.      montelukast 10 MG tablet   Commonly known as: SINGULAIR   Take 10 mg by mouth at bedtime.      rosuvastatin 10 MG tablet   Commonly known as: CRESTOR   Take 10 mg by mouth daily.             Signed: Hewitt Shorts, MD 08/29/2011, 9:32 AM

## 2011-09-03 ENCOUNTER — Encounter (HOSPITAL_COMMUNITY): Payer: Self-pay

## 2012-12-23 ENCOUNTER — Ambulatory Visit: Payer: Self-pay | Admitting: Ophthalmology

## 2014-05-01 NOTE — Op Note (Signed)
PATIENT NAME:  Andrew Lam, Andrew Lam MR#:  [id number] DATE OF BIRTH:  [birth year]  DATE OF PROCEDURE:  04/18/2011  PREOPERATIVE DIAGNOSIS: Chronic recurrent pansinusitis (status post previous endoscopic sinus surgery).   POSTOPERATIVE DIAGNOSIS: Chronic recurrent pansinusitis (status post previous endoscopic sinus surgery).   PROCEDURES:  1. Image guided sinus surgery (Stryker navigation).  2. Revision bilateral frontal sinusotomies with tissue removal.  3. Revision bilateral anterior and posterior ethmoidectomies with tissue removal.  4. Revision bilateral sphenoidectomies with tissue removal.   SURGEON: Janalee Dane, MD   DESCRIPTION OF PROCEDURE: The image-guided sinus surgery system mask was attached and registration was carried out in the standard fashion using the appropriate fiduciary points. Calibration of the system was confirmed and extensive review of the CT scan in all three dimensions preoperatively and intraoperatively was carried out.  Each instrument was registered and confirmed for anatomic accuracy.   The left paranasal sinuses were addressed first. There was significant polypoid degeneration at the ethmoid roof and into the frontal recess, and the frontal recess was completely scarred over. The sphenoid ostium was partially scarred over as well. The maxillary antrostomy was patent. Beginning with the 0-degree Hopkins rod and Diego powered instrumentation, the polyps and bony partitions were meticulously dissected from anterior to posterior along the skull base preserving the skull base and the lamina papyracea. Using multiple instrumentation, the frontal recess was explored with a 45-degree Hopkins rod, and the frontal sinus with a mid frontal partition was completely opened, irrigated copiously with saline. The sphenoid sinus was then opened further with the [facility] and Kerrison rongeurs. A phenylephrine-lidocaine-soaked pledget was then placed on the left side while attention was  directed to the right side where similar findings were encountered except that there was even more polypoid degeneration and some purulence along the right frontal recess. An identical procedure was performed with removal of the polyps and bony partitions and ledges to create a large frontal sinusotomy and clear out the skull base from anterior to posterior. The sphenoid sinus ostium was also enlarged to create a large sphenoid osteotomy. The phenylephrine-lidocaine-soaked pledget was placed on the right while the left side was examined.  Once all of the obstructing bony partitions had been divided, the middle turbinate remnant was cauterized against the nasal septum to Bolgerize the middle turbinate remnant bilaterally. Once copious irrigation had been carried out bilaterally, conservative Surgiflo was placed, followed by Telfa pledgets tied over the columella. The patient was returned to Anesthesia, allowed to emerge from anesthesia in the Operating Room, and taken to the recovery room in stable condition. There were no complications. Estimated blood loss was 20 mL.   ____________________________ Lenna Sciara. Nadeen Landau, MD [facility] D: 04/18/2011 13:49:02 ET T: 04/18/2011 15:07:20 ET JOB#: [id number]  cc: Janalee Dane, MD, <Dictator> Nicholos Johns MD ELECTRONICALLY SIGNED 05/08/2011 18:24

## 2014-09-23 ENCOUNTER — Encounter: Payer: Self-pay | Admitting: *Deleted

## 2014-09-27 NOTE — Discharge Instructions (Signed)

## 2014-09-28 ENCOUNTER — Ambulatory Visit: Payer: BLUE CROSS/BLUE SHIELD | Admitting: Student in an Organized Health Care Education/Training Program

## 2014-09-28 ENCOUNTER — Ambulatory Visit
Admission: RE | Admit: 2014-09-28 | Discharge: 2014-09-28 | Disposition: A | Discharge status: Home / Self Care | Payer: BLUE CROSS/BLUE SHIELD | Source: Ambulatory Visit | Attending: Ophthalmology | Admitting: Ophthalmology

## 2014-09-28 ENCOUNTER — Encounter
Admission: RE | Disposition: A | Discharge status: Home / Self Care | Payer: Self-pay | Source: Ambulatory Visit | Attending: Ophthalmology

## 2014-09-28 DIAGNOSIS — G4730 Sleep apnea, unspecified: Secondary | ICD-10-CM | POA: Diagnosis not present

## 2014-09-28 DIAGNOSIS — Z87891 Personal history of nicotine dependence: Secondary | ICD-10-CM | POA: Insufficient documentation

## 2014-09-28 DIAGNOSIS — H52202 Unspecified astigmatism, left eye: Secondary | ICD-10-CM | POA: Diagnosis not present

## 2014-09-28 DIAGNOSIS — Z885 Allergy status to narcotic agent status: Secondary | ICD-10-CM | POA: Insufficient documentation

## 2014-09-28 DIAGNOSIS — I10 Essential (primary) hypertension: Secondary | ICD-10-CM | POA: Insufficient documentation

## 2014-09-28 DIAGNOSIS — H2512 Age-related nuclear cataract, left eye: Secondary | ICD-10-CM | POA: Diagnosis present

## 2014-09-28 DIAGNOSIS — E039 Hypothyroidism, unspecified: Secondary | ICD-10-CM | POA: Insufficient documentation

## 2014-09-28 HISTORY — DX: Pure hypercholesterolemia, unspecified: E78.00

## 2014-09-28 HISTORY — DX: Anesthesia of skin: R20.0

## 2014-09-28 HISTORY — DX: Diverticulosis of intestine, part unspecified, without perforation or abscess without bleeding: K57.90

## 2014-09-28 HISTORY — PX: CATARACT EXTRACTION W/PHACO: SHX586

## 2014-09-28 HISTORY — DX: Anxiety disorder, unspecified: F41.9

## 2014-09-28 SURGERY — PHACOEMULSIFICATION, CATARACT, WITH IOL INSERTION
Anesthesia: Monitor Anesthesia Care | Laterality: Left | Wound class: Clean

## 2014-09-28 MED ORDER — LACTATED RINGERS IV SOLN: 500.0000 mL | INTRAVENOUS | Status: DC

## 2014-09-28 MED ORDER — EPINEPHRINE HCL 1 MG/ML IJ SOLN
INTRAOCULAR | Status: DC | PRN
  Administered 2014-09-28: 102 mL via OPHTHALMIC

## 2014-09-28 MED ORDER — PROPARACAINE HCL 0.5 % OP SOLN
1.0000 [drp] | Freq: Once | OPHTHALMIC | Status: AC
  Administered 2014-09-28: 1 [drp] via OPHTHALMIC

## 2014-09-28 MED ORDER — ACETAMINOPHEN 160 MG/5ML PO SOLN: 325.0000 mg | ORAL | Status: DC | PRN

## 2014-09-28 MED ORDER — MIDAZOLAM HCL 2 MG/2ML IJ SOLN
INTRAMUSCULAR | Status: DC | PRN
  Administered 2014-09-28: 2 mg via INTRAVENOUS

## 2014-09-28 MED ORDER — CEFUROXIME OPHTHALMIC INJECTION 1 MG/0.1 ML
INJECTION | OPHTHALMIC | Status: DC | PRN
  Administered 2014-09-28: 0.1 mL via INTRACAMERAL

## 2014-09-28 MED ORDER — ACETAMINOPHEN 325 MG PO TABS: 325.0000 mg | ORAL_TABLET | ORAL | Status: DC | PRN

## 2014-09-28 MED ORDER — LACTATED RINGERS IV SOLN: INTRAVENOUS | Status: DC

## 2014-09-28 MED ORDER — BRIMONIDINE TARTRATE 0.2 % OP SOLN
OPHTHALMIC | Status: DC | PRN
  Administered 2014-09-28: 1 [drp] via OPHTHALMIC

## 2014-09-28 MED ORDER — NA HYALUR & NA CHOND-NA HYALUR 0.4-0.35 ML IO KIT
PACK | INTRAOCULAR | Status: DC | PRN
  Administered 2014-09-28: 1 mL via INTRAOCULAR

## 2014-09-28 MED ORDER — OXYCODONE HCL 5 MG/5ML PO SOLN: 5.0000 mg | Freq: Once | ORAL | Status: DC | PRN

## 2014-09-28 MED ORDER — TIMOLOL MALEATE 0.5 % OP SOLN
OPHTHALMIC | Status: DC | PRN
  Administered 2014-09-28: 1 [drp] via OPHTHALMIC

## 2014-09-28 MED ORDER — DEXAMETHASONE SODIUM PHOSPHATE 4 MG/ML IJ SOLN
8.0000 mg | Freq: Once | INTRAMUSCULAR | Status: DC | PRN
Start: 2014-09-28 — End: 2014-09-28

## 2014-09-28 MED ORDER — FENTANYL CITRATE (PF) 100 MCG/2ML IJ SOLN: 25.0000 ug | INTRAMUSCULAR | Status: DC | PRN

## 2014-09-28 MED ORDER — FENTANYL CITRATE (PF) 100 MCG/2ML IJ SOLN
INTRAMUSCULAR | Status: DC | PRN
  Administered 2014-09-28: 100 ug via INTRAVENOUS

## 2014-09-28 MED ORDER — POVIDONE-IODINE 5 % OP SOLN
1.0000 "application " | OPHTHALMIC | Status: DC | PRN
  Administered 2014-09-28: 1 via OPHTHALMIC

## 2014-09-28 MED ORDER — ARMC OPHTHALMIC DILATING GEL
1.0000 "application " | OPHTHALMIC | Status: DC | PRN
  Administered 2014-09-28 (×2): 1 via OPHTHALMIC

## 2014-09-28 MED ORDER — OXYCODONE HCL 5 MG PO TABS: 5.0000 mg | ORAL_TABLET | Freq: Once | ORAL | Status: DC | PRN

## 2014-09-28 SURGICAL SUPPLY — 26 items
CANNULA ANT/CHMB 27GA (MISCELLANEOUS) ×3 IMPLANT
GLOVE SURG LX 7.5 STRW (GLOVE) ×2
GLOVE SURG LX STRL 7.5 STRW (GLOVE) ×1 IMPLANT
GLOVE SURG TRIUMPH 8.0 PF LTX (GLOVE) ×3 IMPLANT
GOWN STRL REUS W/ TWL LRG LVL3 (GOWN DISPOSABLE) ×2 IMPLANT
GOWN STRL REUS W/TWL LRG LVL3 (GOWN DISPOSABLE) ×4
LENS IOL TECNIS 15.0 (Intraocular Lens) ×3 IMPLANT
LENS IOL TECNIS MONO 1P 15.0 (Intraocular Lens) ×1 IMPLANT
MARKER SKIN SURG W/RULER VIO (MISCELLANEOUS) ×3 IMPLANT
NDL RETROBULBAR .5 NSTRL (NEEDLE) IMPLANT
NEEDLE FILTER BLUNT 18X 1/2SAF (NEEDLE) ×2
NEEDLE FILTER BLUNT 18X1 1/2 (NEEDLE) ×1 IMPLANT
PACK CATARACT BRASINGTON (MISCELLANEOUS) ×3 IMPLANT
PACK EYE AFTER SURG (MISCELLANEOUS) ×3 IMPLANT
PACK OPTHALMIC (MISCELLANEOUS) ×3 IMPLANT
RING MALYGIN 7.0 (MISCELLANEOUS) IMPLANT
SUT ETHILON 10-0 CS-B-6CS-B-6 (SUTURE)
SUT VICRYL  9 0 (SUTURE)
SUT VICRYL 9 0 (SUTURE) IMPLANT
SUTURE EHLN 10-0 CS-B-6CS-B-6 (SUTURE) IMPLANT
SYR 3ML LL SCALE MARK (SYRINGE) ×3 IMPLANT
SYR 5ML LL (SYRINGE) IMPLANT
SYR TB 1ML LUER SLIP (SYRINGE) ×3 IMPLANT
WATER STERILE IRR 250ML POUR (IV SOLUTION) ×3 IMPLANT
WATER STERILE IRR 500ML POUR (IV SOLUTION) IMPLANT
WIPE NON LINTING 3.25X3.25 (MISCELLANEOUS) ×3 IMPLANT

## 2014-09-28 NOTE — Anesthesia Procedure Notes (Signed)
Procedure Name: MAC Performed by: BUSH, WENDY Pre-anesthesia Checklist: Patient identified, Emergency Drugs available, Suction available, Timeout performed and Patient being monitored Patient Re-evaluated:Patient Re-evaluated prior to inductionOxygen Delivery Method: Nasal cannula Placement Confirmation: positive ETCO2     

## 2014-09-28 NOTE — Anesthesia Postprocedure Evaluation (Signed)
  Anesthesia Post-op Note  Patient: Andrew Lam  Procedure(s) Performed: Procedure(s) with comments: CATARACT EXTRACTION PHACO AND INTRAOCULAR LENS PLACEMENT (IOC) (Left) - CPAP  Anesthesia type:MAC  Patient location: PACU  Post pain: Pain level controlled  Post assessment: Post-op Vital signs reviewed, Patient's Cardiovascular Status Stable, Respiratory Function Stable, Patent Airway and No signs of Nausea or vomiting  Post vital signs: Reviewed and stable  Last Vitals:  Filed Vitals:   09/28/14 0639  BP: 118/73  Pulse: 63  Temp: 36.3 C  Resp: 16    Level of consciousness: awake, alert  and patient cooperative  Complications: No apparent anesthesia complications

## 2014-09-28 NOTE — Op Note (Signed)
OPERATIVE NOTE  Andrew Lam [id number] 09/28/2014   PREOPERATIVE DIAGNOSIS:  Nuclear sclerotic cataract left eye. H25.12   POSTOPERATIVE DIAGNOSIS:    Nuclear sclerotic cataract left eye.     PROCEDURE:  Phacoemusification with posterior chamber intraocular lens placement of the left eye   LENS:   Implant Name Type Inv. Item Serial No. Manufacturer Lot No. LRB No. Used  LENS IMPL INTRAOC [id number] 15.0 - [id number] Intraocular Lens LENS IMPL INTRAOC [id number] 15.0 [id number]   Left 1        ULTRASOUND TIME: 13  % of 1 minutes 11 seconds, CDE 9.7  SURGEON:  Wyonia Hough, MD   ANESTHESIA:  Topical with tetracaine drops and 2% Xylocaine jelly.   COMPLICATIONS:  None.   DESCRIPTION OF PROCEDURE:  The patient was identified in the holding room and transported to the operating room and placed in the supine position under the operating microscope.  The left eye was identified as the operative eye and it was prepped and draped in the usual sterile ophthalmic fashion.   A 1 millimeter clear-corneal paracentesis was made at the 1:30 position.  The anterior chamber was filled with Viscoat viscoelastic.  A 2.4 millimeter keratome was used to make a near-clear corneal incision at the 10:30 position.  .  A curvilinear capsulorrhexis was made with a cystotome and capsulorrhexis forceps.  Balanced salt solution was used to hydrodissect and hydrodelineate the nucleus.   Phacoemulsification was then used in stop and chop fashion to remove the lens nucleus and epinucleus.  The remaining cortex was then removed using the irrigation and aspiration handpiece. Provisc was then placed into the capsular bag to distend it for lens placement.  A lens was then injected into the capsular bag.  The remaining viscoelastic was aspirated.   Wounds were hydrated with balanced salt solution.  The anterior chamber was inflated to a physiologic pressure with balanced salt solution.  No wound leaks were noted.  Cefuroxime 0.1 ml of a 10mg /ml solution was injected into the anterior chamber for a dose of 1 mg of intracameral antibiotic at the completion of the case.   Timolol and Brimonidine drops were applied to the eye.  The patient was taken to the recovery room in stable condition without complications of anesthesia or surgery.  Andrew Lam 09/28/2014, 8:04 AM

## 2014-09-28 NOTE — Anesthesia Preprocedure Evaluation (Addendum)
Anesthesia Evaluation  Patient identified by MRN, date of birth, ID band Patient awake    Reviewed: Allergy & Precautions, H&P , NPO status , Patient's Chart, lab work & pertinent test results, reviewed documented beta blocker date and time   Airway Mallampati: II  TM Distance: >3 FB Neck ROM: full    Dental no notable dental hx.    Pulmonary sleep apnea , former smoker,    Pulmonary exam normal breath sounds clear to auscultation       Cardiovascular Exercise Tolerance: Good hypertension,  Rhythm:regular Rate:Normal     Neuro/Psych negative neurological ROS  negative psych ROS   GI/Hepatic negative GI ROS, Neg liver ROS,   Endo/Other  Hypothyroidism   Renal/GU Renal disease  negative genitourinary   Musculoskeletal   Abdominal   Peds  Hematology negative hematology ROS (+)   Anesthesia Other Findings   Reproductive/Obstetrics negative OB ROS                            Anesthesia Physical Anesthesia Plan  ASA: II  Anesthesia Plan: MAC   Post-op Pain Management:    Induction:   Airway Management Planned:   Additional Equipment:   Intra-op Plan:   Post-operative Plan:   Informed Consent: I have reviewed the patients History and Physical, chart, labs and discussed the procedure including the risks, benefits and alternatives for the proposed anesthesia with the patient or authorized representative who has indicated his/her understanding and acceptance.     Plan Discussed with: CRNA  Anesthesia Plan Comments:         Anesthesia Quick Evaluation

## 2014-09-28 NOTE — Transfer of Care (Signed)
Immediate Anesthesia Transfer of Care Note  Patient: Andrew Lam  Procedure(s) Performed: Procedure(s) with comments: CATARACT EXTRACTION PHACO AND INTRAOCULAR LENS PLACEMENT (IOC) (Left) - CPAP  Patient Location: PACU  Anesthesia Type: MAC  Level of Consciousness: awake, alert  and patient cooperative  Airway and Oxygen Therapy: Patient Spontanous Breathing and Patient connected to supplemental oxygen  Post-op Assessment: Post-op Vital signs reviewed, Patient's Cardiovascular Status Stable, Respiratory Function Stable, Patent Airway and No signs of Nausea or vomiting  Post-op Vital Signs: Reviewed and stable  Complications: No apparent anesthesia complications

## 2014-09-28 NOTE — H&P (Signed)
  The History and Physical notes were scanned in.  The patient remains stable and unchanged from the H&P.   Previous H&P reviewed, patient examined, and there are no changes.  BRASINGTON,CHADWICK 09/28/2014 7:31 AM

## 2014-09-29 ENCOUNTER — Encounter: Payer: Self-pay | Admitting: Ophthalmology

## 2015-12-22 ENCOUNTER — Encounter: Payer: Self-pay | Admitting: Emergency Medicine

## 2015-12-22 ENCOUNTER — Emergency Department: Payer: BLUE CROSS/BLUE SHIELD

## 2015-12-22 ENCOUNTER — Emergency Department
Admission: EM | Admit: 2015-12-22 | Discharge: 2015-12-22 | Payer: BLUE CROSS/BLUE SHIELD | Attending: Emergency Medicine | Admitting: Emergency Medicine

## 2015-12-22 DIAGNOSIS — I10 Essential (primary) hypertension: Secondary | ICD-10-CM | POA: Diagnosis not present

## 2015-12-22 DIAGNOSIS — C189 Malignant neoplasm of colon, unspecified: Secondary | ICD-10-CM | POA: Insufficient documentation

## 2015-12-22 DIAGNOSIS — H5340 Unspecified visual field defects: Secondary | ICD-10-CM | POA: Diagnosis present

## 2015-12-22 DIAGNOSIS — Z87891 Personal history of nicotine dependence: Secondary | ICD-10-CM | POA: Diagnosis not present

## 2015-12-22 DIAGNOSIS — E039 Hypothyroidism, unspecified: Secondary | ICD-10-CM | POA: Insufficient documentation

## 2015-12-22 DIAGNOSIS — C7931 Secondary malignant neoplasm of brain: Secondary | ICD-10-CM | POA: Diagnosis not present

## 2015-12-22 DIAGNOSIS — Z79899 Other long term (current) drug therapy: Secondary | ICD-10-CM | POA: Diagnosis not present

## 2015-12-22 LAB — COMPREHENSIVE METABOLIC PANEL
ALK PHOS: 169 U/L — AB (ref 38–126)
ALT: 58 U/L (ref 17–63)
AST: 44 U/L — AB (ref 15–41)
Albumin: 3.4 g/dL — ABNORMAL LOW (ref 3.5–5.0)
Anion gap: 5 (ref 5–15)
BUN: 18 mg/dL (ref 6–20)
CALCIUM: 8.6 mg/dL — AB (ref 8.9–10.3)
CHLORIDE: 104 mmol/L (ref 101–111)
CO2: 22 mmol/L (ref 22–32)
CREATININE: 1.28 mg/dL — AB (ref 0.61–1.24)
GFR calc Af Amer: >60 mL/min (ref >60)
GFR, EST NON AFRICAN AMERICAN: 57 mL/min — AB (ref >60)
Glucose, Bld: 98 mg/dL (ref 65–99)
Potassium: 4.2 mmol/L (ref 3.5–5.1)
Sodium: 131 mmol/L — ABNORMAL LOW (ref 135–145)
Total Bilirubin: 1 mg/dL (ref 0.3–1.2)
Total Protein: 8.1 g/dL (ref 6.5–8.1)

## 2015-12-22 LAB — CBC WITH DIFFERENTIAL/PLATELET
Basophils Absolute: 0 10*3/uL (ref 0–0.1)
Basophils Relative: 0 %
EOS ABS: 0.3 10*3/uL (ref 0–0.7)
EOS PCT: 5 %
HCT: 32.1 % — ABNORMAL LOW (ref 40.0–52.0)
Hemoglobin: 10.7 g/dL — ABNORMAL LOW (ref 13.0–18.0)
LYMPHS ABS: 0.3 10*3/uL — AB (ref 1.0–3.6)
Lymphocytes Relative: 6 %
MCH: 35.2 pg — AB (ref 26.0–34.0)
MCHC: 33.5 g/dL (ref 32.0–36.0)
MCV: 105.2 fL — ABNORMAL HIGH (ref 80.0–100.0)
MONOS PCT: 7 %
Monocytes Absolute: 0.4 10*3/uL (ref 0.2–1.0)
Neutro Abs: 4.2 10*3/uL (ref 1.4–6.5)
Neutrophils Relative %: 82 %
PLATELETS: 178 10*3/uL (ref 150–440)
RBC: 3.05 MIL/uL — ABNORMAL LOW (ref 4.40–5.90)
RDW: 18.6 % — ABNORMAL HIGH (ref 11.5–14.5)
WBC: 5.1 10*3/uL (ref 3.8–10.6)

## 2015-12-22 MED ORDER — DEXAMETHASONE SODIUM PHOSPHATE 4 MG/ML IJ SOLN
10.0000 mg | Freq: Once | INTRAMUSCULAR | Status: AC
  Administered 2015-12-22: 10 mg via INTRAVENOUS
  Filled 2015-12-22: qty 3

## 2015-12-22 MED ORDER — DEXAMETHASONE 4 MG PO TABS: 4.0000 mg | ORAL_TABLET | Freq: Four times a day (QID) | ORAL | Status: DC

## 2015-12-22 MED ORDER — GADOBENATE DIMEGLUMINE 529 MG/ML IV SOLN
20.0000 mL | Freq: Once | INTRAVENOUS | Status: AC | PRN
  Administered 2015-12-22: 20 mL via INTRAVENOUS

## 2015-12-22 MED ORDER — LORAZEPAM 2 MG/ML IJ SOLN
1.0000 mg | Freq: Once | INTRAMUSCULAR | Status: AC
  Administered 2015-12-22: 1 mg via INTRAVENOUS
  Filled 2015-12-22: qty 1

## 2015-12-22 NOTE — ED Notes (Signed)
Pt. Transported via [facility] to [facility] ED.

## 2015-12-22 NOTE — ED Notes (Signed)
Patient transported to X-ray 

## 2015-12-22 NOTE — ED Triage Notes (Signed)
Pt sent over from eye dr for further eval of possible stroke in right eye. States had flashes of light last night and loss of vision in his right eye and can only see half of a word today. Pt came directly from the eye dr.

## 2015-12-22 NOTE — ED Provider Notes (Addendum)
[facility] Emergency Department Provider Note   ____________________________________________   First MD Initiated Contact with Patient 12/22/15 1219     (approximate)  I have reviewed the triage vital signs and the nursing notes.   HISTORY  Chief Complaint Visual Field Change    HPI KAYSEN FLORA is a 65 y.o. male patient comes from eye doctor for possible stroke. He reports he's had lens replacements for cataracts in the past he had last night episodes of flashing lights in the left eye last about 15 minutes and stopped And again in the morning and again at work. He noticed when he was looking at a sign he could not read the whole sign without difficulty. He was seen by [facility] and sent here. Other history is that patient was diagnosed with colon cancer at age 57 came back about 3 years ago he is currently scheduled to go to a specialty hospital in [place] for immunotherapy for metastases to the liver.   Past Medical History:  Diagnosis Date  . Anxiety    during CA tx  . Arthritis    shoulders, neck  . 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 Diverticulosis   . Hypercholesteremia   . Hypertension   . Hypothyroidism   . 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 Numbness of toes    s/p chemo  . Sleep apnea    cpap occ use  sleep study  8 months ago    There are no active problems to display for this patient.   Past Surgical History:  Procedure Laterality Date  . ANTERIOR CERVICAL DECOMP/DISCECTOMY FUSION  08/28/2011   Procedure: ANTERIOR CERVICAL DECOMPRESSION/DISCECTOMY FUSION 3 LEVELS;  Surgeon: Hosie Spangle, MD;  Location: [place] NEURO ORS;  Service: Neurosurgery;  Laterality: N/A;  Cervical four-five, Cervical five-six, Cervical six-seven anterior cervical decompression with fusion plating and bonegraft  . APPENDECTOMY    . CARDIAC CATHETERIZATION     0 stents - yrs ago  . CATARACT  EXTRACTION W/ INTRAOCULAR LENS IMPLANT Right   . CATARACT EXTRACTION W/PHACO Left 09/28/2014   Procedure: CATARACT EXTRACTION PHACO AND INTRAOCULAR LENS PLACEMENT (IOC);  Surgeon: Leandrew Koyanagi, MD;  Location: [place];  Service: Ophthalmology;  Laterality: Left;  CPAP  . COLON SURGERY  89   ca  . HERNIA REPAIR  11   lft  . NASAL SINUS SURGERY  13    Prior to Admission medications   Medication Sig Start Date End Date Taking? Authorizing Provider  amLODipine (NORVASC) 10 MG tablet Take 10 mg by mouth daily.    Historical Provider, MD  amLODipine-valsartan (EXFORGE) 10-160 MG per tablet Take 1 tablet by mouth daily.    Historical Provider, MD  Astragalus Extract POWD 1,300 mg by Does not apply route daily.    Historical Provider, MD  CYANOCOBALAMIN PO Take 1 tablet by mouth daily.    Historical Provider, MD  levothyroxine (SYNTHROID, LEVOTHROID) 200 MCG tablet Take 250 mcg by mouth daily.     Historical Provider, MD  montelukast (SINGULAIR) 10 MG tablet Take 10 mg by mouth at bedtime.    Historical Provider, MD  NON FORMULARY 750 mg. [place]    Historical Provider, MD  rosuvastatin (CRESTOR) 10 MG tablet Take 10 mg by mouth daily.    Historical Provider, MD  timolol (BETIMOL) 0.5 % ophthalmic solution 1 drop 2 (two) times daily.  Historical Provider, MD  valsartan (DIOVAN) 320 MG tablet Take 320 mg by mouth daily.    Historical Provider, MD    Allergies Codeine  No family history on file.  Social History Social History  Substance Use Topics  . Smoking status: Former Smoker    Packs/day: 1.50    Years: 20.00    Types: Cigarettes    Quit date: 08/19/1990  . Smokeless tobacco: Never Used  . Alcohol use No    Review of Systems Constitutional: No fever/chills Eyes: No visual changes. ENT: No sore throat. Cardiovascular: Denies chest pain. Respiratory: Denies shortness of breath. Gastrointestinal: No abdominal pain.  No nausea, no vomiting.  No diarrhea.  No  constipation. Genitourinary: Negative for dysuria. Musculoskeletal: Negative for back pain. Skin: Negative for rash. Neurological: Negative for headaches, focal weakness or numbness.  10-point ROS otherwise negative.  ____________________________________________   PHYSICAL EXAM:  VITAL SIGNS: ED Triage Vitals  Enc Vitals Group     BP 12/22/15 1153 (!) 145/78     Pulse Rate 12/22/15 1153 71     Resp 12/22/15 1153 18     Temp 12/22/15 1153 97.8 F (36.6 C)     Temp Source 12/22/15 1153 Oral     SpO2 12/22/15 1153 100 %     Weight 12/22/15 1151 246 lb (111.6 kg)     Height 12/22/15 1151 6\' 2"  (1.88 m)     Head Circumference --      Peak Flow --      Pain Score 12/22/15 1151 2     Pain Loc --      Pain Edu? --      Excl. in [place]? --     Constitutional: Alert and oriented. Well appearing and in no acute distress. Eyes: Conjunctivae are normal. PERRL. EOMI.Funduscopic normal Head: Atraumatic. Nose: No congestion/rhinnorhea. Mouth/Throat: Mucous membranes are moist.  Oropharynx non-erythematous. Neck: No stridor. Cardiovascular: Normal rate, regular rhythm. Grossly normal heart sounds.  Good peripheral circulation. Respiratory: Normal respiratory effort.  No retractions. Lungs CTAB. Gastrointestinal: Soft and nontender. No distention. No abdominal bruits. No CVA tenderness. Musculoskeletal: No lower extremity tenderness nor edema.  No joint effusions. Neurologic:  Normal speech and language. No gross focal neurologic deficits are appreciated. Cranial nerves II through XII are intact specifically visual fields by confrontation are normal no other deficits in the cranial nerves are found cerebellar finger-nose rapid alternating movements in the hands are normal motor strength is 5 over 5 throughout sensation is intact No gait instability. Skin:  Skin is warm, dry and intact. No rash noted. Psychiatric: Mood and affect are normal. Speech and behavior are  normal.  ____________________________________________   LABS (all labs ordered are listed, but only abnormal results are displayed)  Labs Reviewed  COMPREHENSIVE METABOLIC PANEL - Abnormal; Notable for the following:       Result Value   Sodium 131 (*)    Creatinine, Ser 1.28 (*)    Calcium 8.6 (*)    Albumin 3.4 (*)    AST 44 (*)    Alkaline Phosphatase 169 (*)    GFR calc non Af Amer 57 (*)    All other components within normal limits  CBC WITH DIFFERENTIAL/PLATELET - Abnormal; Notable for the following:    RBC 3.05 (*)    Hemoglobin 10.7 (*)    HCT 32.1 (*)    MCV 105.2 (*)    MCH 35.2 (*)    RDW 18.6 (*)    Lymphs Abs 0.3 (*)  All other components within normal limits   ____________________________________________  EKG   ____________________________________________  RADIOLOGY  Study Result   CLINICAL DATA:  Right high pain. Loss of vision in the right eye today. No known injury.  EXAM: CT HEAD WITHOUT CONTRAST  TECHNIQUE: Contiguous axial images were obtained from the base of the skull through the vertex without intravenous contrast.  COMPARISON:  None.  FINDINGS: Brain: A mass is seen in the right occipital lobe measures 2.1 x 1.9 x 2.3 cm with extensive surrounding vasogenic edema consistent with either primary brain neoplasm or metastatic disease. No hemorrhage, midline shift or abnormal extra-axial fluid collection. No evidence of acute infarction.  Vascular: Atherosclerosis noted.  Skull: Intact.  No focal lesion.  Sinuses/Orbits: The patient is status post ethmoidectomy. Mucosal thickening is seen in the right frontal sinus and right ethmoid cavity. Orbits are unremarkable.  Other: None.  IMPRESSION: Findings consistent with either a primary or metastatic brain tumor in the right occipital lobe. Brain MRI with and without contrast is recommended for further evaluation. Correlation with chest x-ray is also  recommended.  Status post S splenectomy with mucosal thickening seen the right ethmoid cavity and frontal sinus  These results were called by telephone at the time of interpretation on 12/22/2015 at 12:39 pm to Dr. Lenise Arena , who verbally acknowledged these results.   Electronically Signed   By: Inge Rise M.D.   On: 12/22/2015 12:39    Study Result   CLINICAL DATA:  65 year old male with right occipital lobe brain mass detected on noncontrast head CT today performed for right side vision loss. Personal history of colon cancer.  EXAM: MRI HEAD WITHOUT AND WITH CONTRAST  TECHNIQUE: Multiplanar, multiecho pulse sequences of the brain and surrounding structures were obtained without and with intravenous contrast.  CONTRAST:  48mL MULTIHANCE GADOBENATE DIMEGLUMINE 529 MG/ML IV SOLN  COMPARISON:  Head CT without contrast 1219 hours today.  FINDINGS: Brain: Round heterogeneously enhancing 25 mm mass in the right occipital pole with surrounding T2 and FLAIR hyperintensity compatible with vasogenic edema. Mild regional mass effect. No dural involvement. No midline shift.  No additional brain mass or abnormal enhancement identified. Mild nonspecific bilateral periatrial white matter T2 and FLAIR hyperintensity. No restricted diffusion or evidence of acute infarction. No ventriculomegaly. No acute intracranial hemorrhage identified. No extra-axial collection. Negative pituitary and cervicomedullary junction.  Vascular: Major intracranial vascular flow voids are preserved.  Skull and upper cervical spine: Negative visualized cervical spine and spinal cord. Visualized bone marrow signal is within normal limits.  Sinuses/Orbits: Postoperative changes to the globes, otherwise negative orbits soft tissues. Scattered paranasal sinus mucosal thickening. Previous maxillary antrostomies.  Other: Visible internal auditory structures appear normal.  Mastoids are clear. Negative scalp soft tissues.  IMPRESSION: 1. Solitary 25 mm intra-axial tumor in the right occipital pole with surrounding vasogenic edema. Favor solitary metastasis rather than primary CNS tumor (high-grade glioma, CNS lymphoma). 2. No other acute intracranial abnormality. 3. Chronic paranasal sinus disease.   Electronically Signed   By: Genevie Ann M.D.   On: 12/22/2015 15:05    ____________________________________________   PROCEDURES  Procedure(s) performed:   Procedures  Critical Care performed:   ____________________________________________   INITIAL IMPRESSION / ASSESSMENT AND PLAN / ED COURSE  Pertinent labs & imaging results that were available during my care of the patient were reviewed by me and considered in my medical decision making (see chart for details).    Clinical Course     Discussed patient  with Dr. Marca Ancona Monday he calls the radiation oncology doctors and discusses the patient with them and upshot is that would be better to transfer the patient [facility] or [facility]. Patient wishes to go to [facility] to talk to Dr. Vernell Leep and Dr. Lethea Killings at [facility] and patient will go to the ER there. ____________________________________________   FINAL CLINICAL IMPRESSION(S) / ED DIAGNOSES  Final diagnoses:  Brain metastasis ([place])      NEW MEDICATIONS STARTED DURING THIS VISIT:  New Prescriptions   No medications on file     Note:  This document was prepared using Dragon voice recognition software and may include unintentional dictation errors.    Nena Polio, MD 12/22/15 1549    Nena Polio, MD 12/22/15 [phone]

## 2016-05-07 DEATH — deceased

## 2016-12-22 IMAGING — MR MR HEAD WO/W CM
11 of 12 series · 44 of 48 positions shown · IV contrast (multihance)
Comparison: Head CT without contrast 6365 hours today.

CLINICAL DATA: 65-year-old male with right occipital lobe brain
mass detected on noncontrast head CT today performed for right side
vision loss. Personal history of colon cancer.

EXAM:
MRI HEAD WITHOUT AND WITH CONTRAST
TECHNIQUE: Multiplanar, multiecho pulse sequences of the brain and surrounding
structures were obtained without and with intravenous contrast.
CONTRAST:  20mL MULTIHANCE GADOBENATE DIMEGLUMINE 529 MG/ML IV SOLN

[Series 2: GRE · sagittal · 5.0mm · 0.45mm/px · 3 of 27 slices shown (1 of 3)]
[im 1/27]
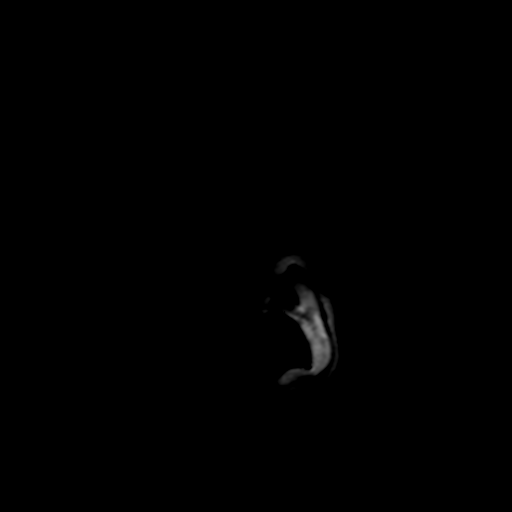
[im 14/27]
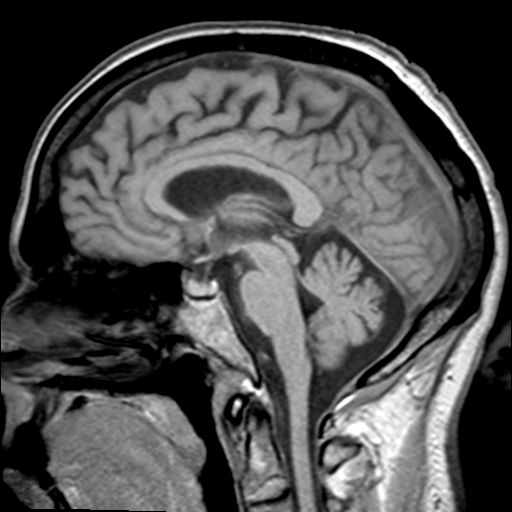
[im 27/27]
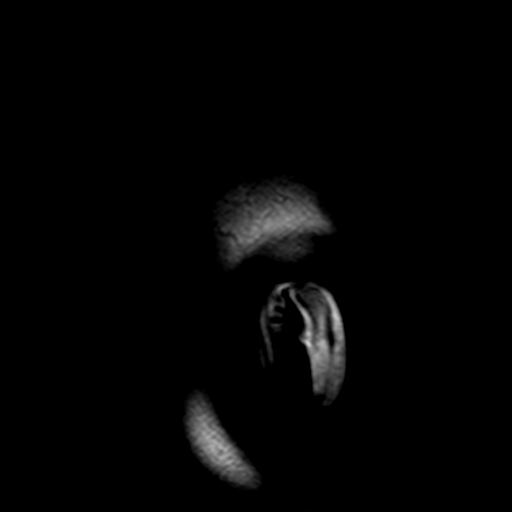

[Series 4: DWI · axial · 3.0mm · 1.80mm/px · z∈[-54,+108]mm · 6 of 55 slices shown (1 of 4)]
[im 1/55]
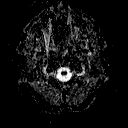
[im 11/55]
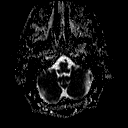
[im 22/55]
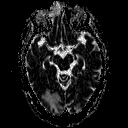
[im 33/55]
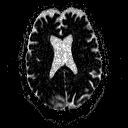
[im 44/55]
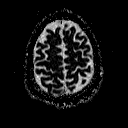
[im 55/55]
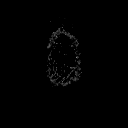

[Series 6: DWI · coronal · 3.0mm · 1.80mm/px · 6 of 52 slices shown (2 of 4)]
[im 1/52]
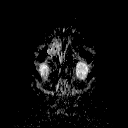
[im 11/52]
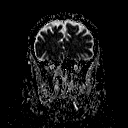
[im 21/52]
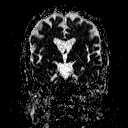
[im 31/52]
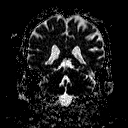
[im 41/52]
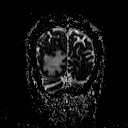
[im 52/52]
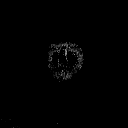

[Series 7: T2 · axial · 5.0mm · 0.49mm/px · z∈[-60,+115]mm · 3 of 28 slices shown (1 of 3)]
[im 1/28]
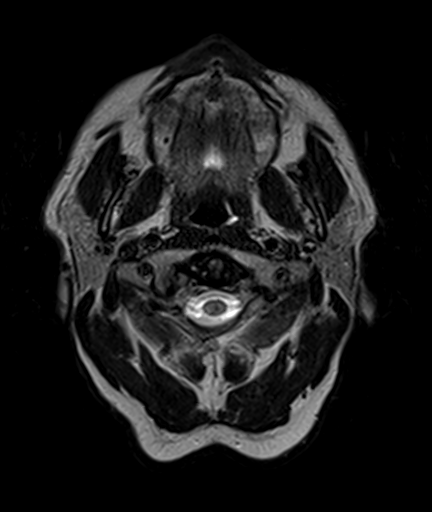
[im 14/28]
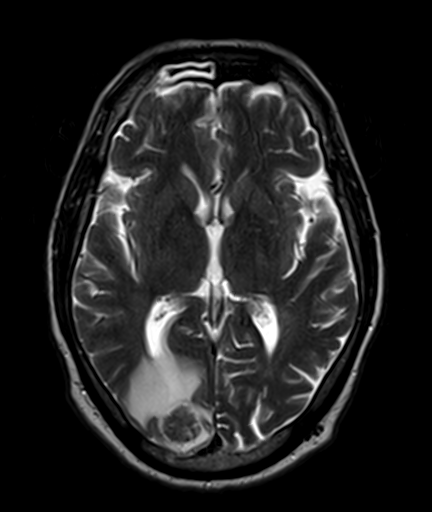
[im 28/28]
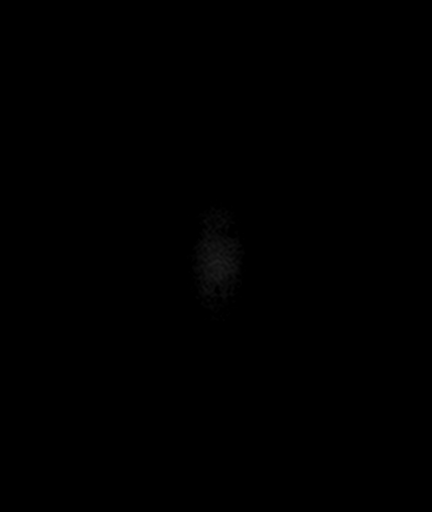

[Series 8: FLAIR · axial · 5.0mm · 0.49mm/px · z∈[-51,+105]mm · 3 of 25 slices shown]
[im 1/25]
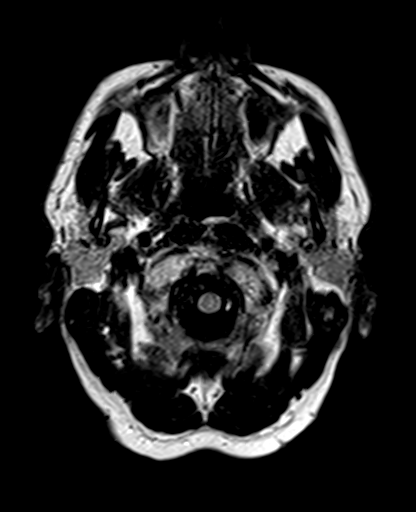
[im 13/25]
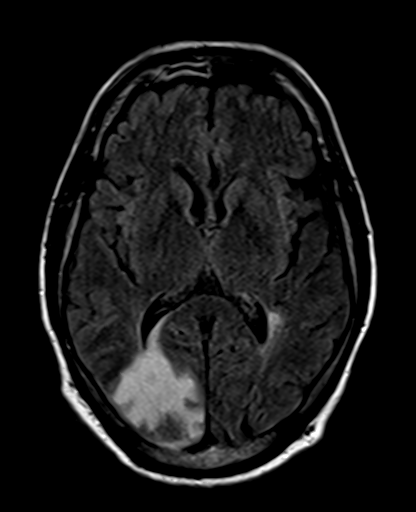
[im 25/25]
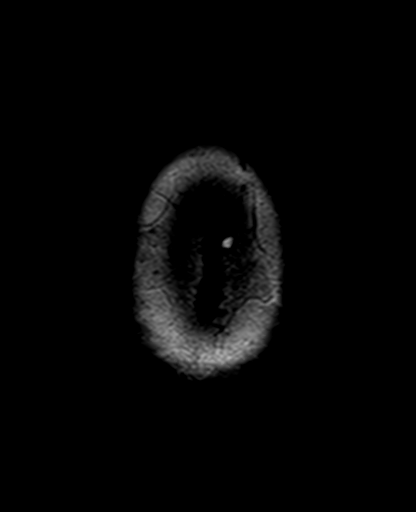

[Series 9: T2 · axial · 5.0mm · 1.30mm/px · z∈[-60,+115]mm · 3 of 28 slices shown (2 of 3)]
[im 1/28]
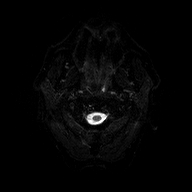
[im 14/28]
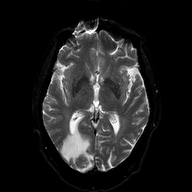
[im 28/28]
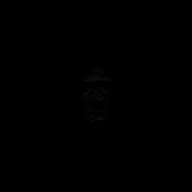

[Series 10: GRE · axial · 5.0mm · 0.49mm/px · z∈[-51,+105]mm · 3 of 25 slices shown (2 of 3)]
[im 1/25]
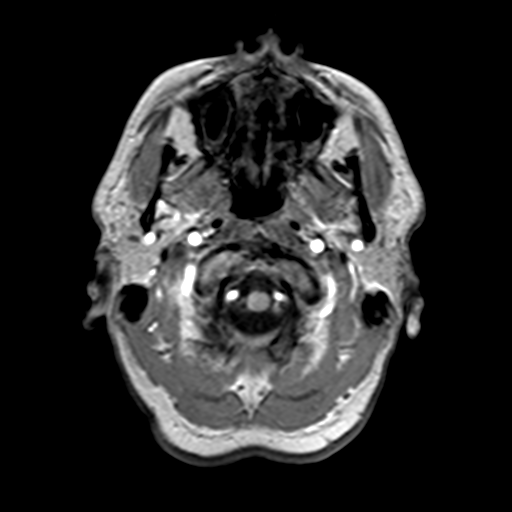
[im 13/25]
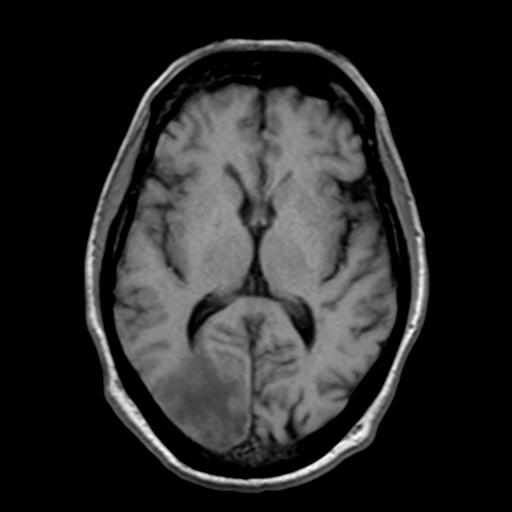
[im 25/25]
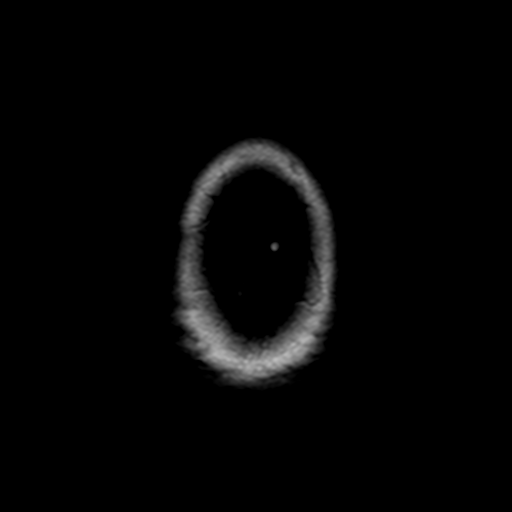

[Series 11: T2 · coronal · 5.5mm · 0.45mm/px · 3 of 27 slices shown (3 of 3)]
[im 1/27]
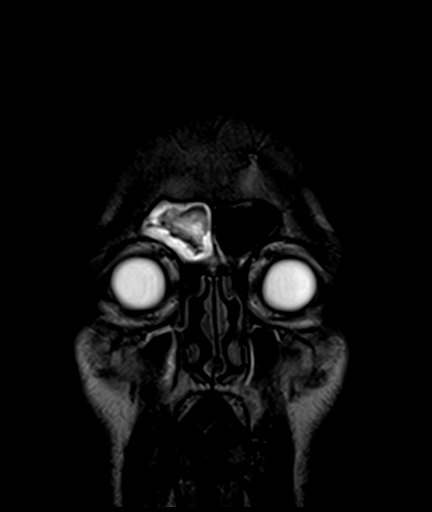
[im 14/27]
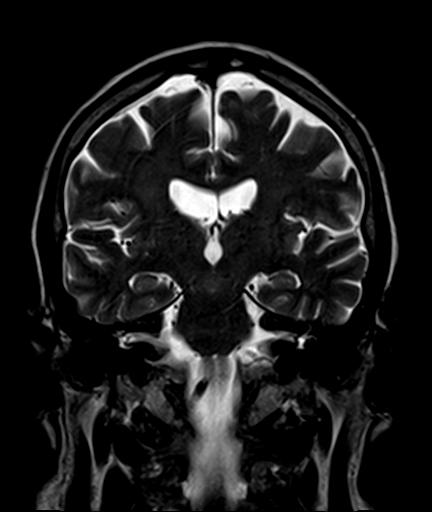
[im 27/27]
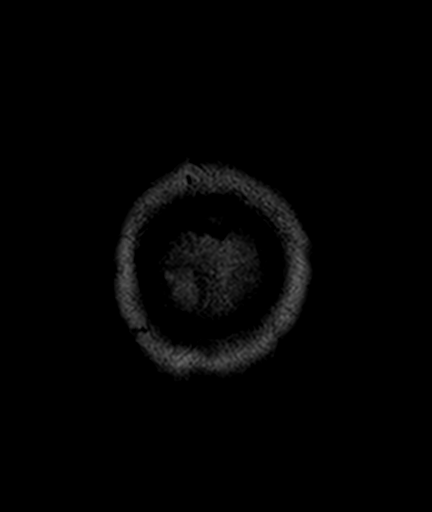

[Series 12: GRE · axial · 5.0mm · 0.49mm/px · z∈[-51,+27]mm · 2 of 25 slices shown (3 of 3)]
[im 1/25]
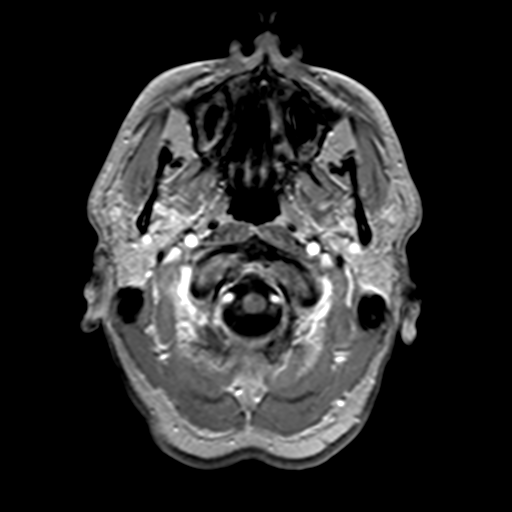
[im 13/25]
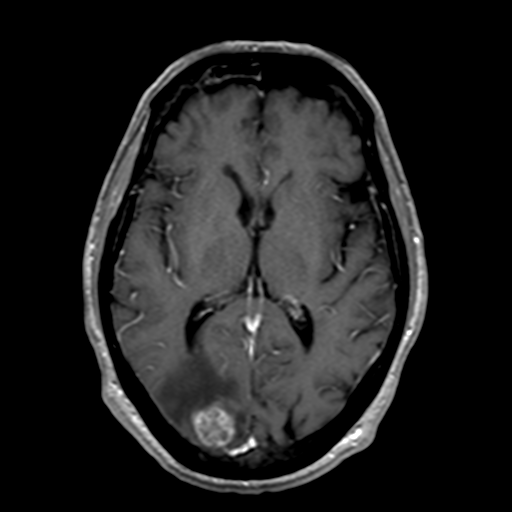

[Series 100: DWI · axial · 3.0mm · 1.80mm/px · z∈[-54,+108]mm · 6 of 55 slices shown (3 of 4)]
[im 1/55]
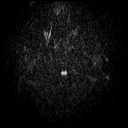
[im 11/55]
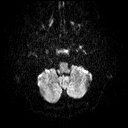
[im 22/55]
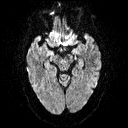
[im 33/55]
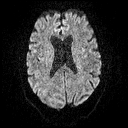
[im 44/55]
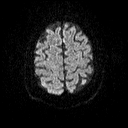
[im 55/55]
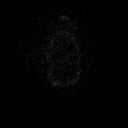

[Series 101: DWI · coronal · 3.0mm · 1.80mm/px · 6 of 51 slices shown (4 of 4)]
[im 1/51]
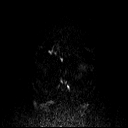
[im 11/51]
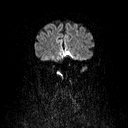
[im 21/51]
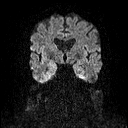
[im 31/51]
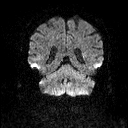
[im 41/51]
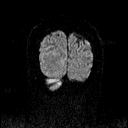
[im 51/51]
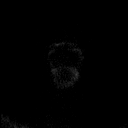

[44 of 48 positions shown; findings below may reference images not displayed]

FINDINGS: Brain: Round heterogeneously enhancing 25 mm mass in the right
occipital pole with surrounding T2 and FLAIR hyperintensity
compatible with vasogenic edema. Mild regional mass effect. No dural
involvement. No midline shift.

No additional brain mass or abnormal enhancement identified. Mild
nonspecific bilateral periatrial white matter T2 and FLAIR
hyperintensity. No restricted diffusion or evidence of acute
infarction. No ventriculomegaly. No acute intracranial hemorrhage
identified. No extra-axial collection. Negative pituitary and
cervicomedullary junction.

Vascular: Major intracranial vascular flow voids are preserved.

Skull and upper cervical spine: Negative visualized cervical spine
and spinal cord. Visualized bone marrow signal is within normal
limits.

Sinuses/Orbits: Postoperative changes to the globes, otherwise
negative orbits soft tissues. Scattered paranasal sinus mucosal
thickening. Previous maxillary antrostomies.

Other: Visible internal auditory structures appear normal. Mastoids
are clear. Negative scalp soft tissues.
IMPRESSION: 1. Solitary 25 mm intra-axial tumor in the right occipital pole with
surrounding vasogenic edema. Favor solitary metastasis rather than
primary CNS tumor (high-grade glioma, CNS lymphoma).
2. No other acute intracranial abnormality.
3. Chronic paranasal sinus disease.
